# Patient Record
Sex: Female | Born: 1970
Health system: Southern US, Community
[De-identification: ages and names within clinical notes are randomized; demographics above are authoritative.]

## PROBLEM LIST (undated history)

## (undated) DIAGNOSIS — E669 Obesity, unspecified: Secondary | ICD-10-CM

## (undated) DIAGNOSIS — E785 Hyperlipidemia, unspecified: Secondary | ICD-10-CM

## (undated) DIAGNOSIS — I10 Essential (primary) hypertension: Secondary | ICD-10-CM

## (undated) DIAGNOSIS — D219 Benign neoplasm of connective and other soft tissue, unspecified: Secondary | ICD-10-CM

## (undated) DIAGNOSIS — E119 Type 2 diabetes mellitus without complications: Secondary | ICD-10-CM

## (undated) HISTORY — DX: Type 2 diabetes mellitus without complications: E11.9

## (undated) HISTORY — DX: Benign neoplasm of connective and other soft tissue, unspecified: D21.9

## (undated) HISTORY — DX: Obesity, unspecified: E66.9

## (undated) HISTORY — DX: Essential (primary) hypertension: I10

## (undated) HISTORY — DX: Hyperlipidemia, unspecified: E78.5

---
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
## 2002-04-27 HISTORY — PX: ABDOMINAL HYSTERECTOMY: SHX81

## 2002-04-27 HISTORY — PX: SUPRACERVICAL ABDOMINAL HYSTERECTOMY: SHX5393

## 2005-03-18 ENCOUNTER — Ambulatory Visit: Payer: Self-pay | Admitting: Urology

## 2006-06-09 ENCOUNTER — Ambulatory Visit: Payer: Self-pay | Admitting: Urology

## 2008-06-25 ENCOUNTER — Ambulatory Visit: Payer: Self-pay | Admitting: Urology

## 2009-07-25 ENCOUNTER — Ambulatory Visit: Payer: Self-pay | Admitting: Family Medicine

## 2009-07-30 ENCOUNTER — Ambulatory Visit: Payer: Self-pay | Admitting: Urology

## 2012-05-19 ENCOUNTER — Encounter: Payer: Self-pay | Admitting: *Deleted

## 2012-05-19 ENCOUNTER — Encounter: Payer: 59 | Attending: Internal Medicine | Admitting: *Deleted

## 2012-05-19 VITALS — Ht 66.0 in | Wt 231.0 lb

## 2012-05-19 DIAGNOSIS — E669 Obesity, unspecified: Secondary | ICD-10-CM | POA: Insufficient documentation

## 2012-05-19 DIAGNOSIS — I10 Essential (primary) hypertension: Secondary | ICD-10-CM | POA: Insufficient documentation

## 2012-05-19 DIAGNOSIS — Z713 Dietary counseling and surveillance: Secondary | ICD-10-CM | POA: Insufficient documentation

## 2012-05-19 NOTE — Progress Notes (Signed)
  Medical Nutrition Therapy:  Appt start time: 0800 end time:  0900.  Assessment:  Obesity, HTN.  Katrina Roth comes today for help with weight management. Reports she has a solitary kidney since childhood and worried about taking in too much protein.   TANITA  BODY COMP RESULTS  05/19/12   BMI (kg/m^2) 37.3   Fat Mass (lbs) 111.0   Fat Free Mass (lbs) 120.0   Total Body Water (lbs) 88.0   MEDICATIONS: Reconciled with patient during visit   DIETARY INTAKE:  Usual eating pattern includes 3 meals and 0-2 snacks per day.  24-hr recall:  B (7:30 AM): 6 pk Lance crackers, Clif Bar (choc chip or pb choc chip)  Snk (10-11 AM): Orange or apple OR nothing  L (1 PM): 2 cups red or green leaf salad with (3 tbsp) chickpeas, (2 tbsp) dried cranberries, (2 tbsp) pumpkin seeds, (2 tbsp) sunflower seeds, (2 tbsp) dried pineapple or raisins  Snk (PM): Nothing D (PM): Rotissere chicken with broccoli, cauliflower, or winter squash (2 vegs) OR [employer] (1 ea) wing, breast and pinto beans (about 1x/week) Snk (PM): Nothing Beverages: Coke Zero, diet Dr. Reino Kent  Usual physical activity:  ~ 20 min/4 days a week on elliptical   Estimated energy needs: 1400 calories 175-200 g carbohydrates 60-70 g protein (0.8 g/kg ABW) 40-45 g fat  Progress Towards Goal(s):  In progress.   Nutritional Diagnosis:  [place]-3.3 Overweight/obesity As related to poor food choices and sedentary lifestyle.  As evidenced by patient reported food recall and a BMI of  kg/m^2.    Intervention:  Nutrition education including CHO metabolism and affect on weight loss/gain, meal planning, .  Handouts given during visit include:  Meal Planning Card  Low CHO Snack List  Monitoring/Evaluation:  Dietary intake, exercise, and body weight in 2 week(s) per pt request.

## 2012-05-19 NOTE — Patient Instructions (Addendum)
Goals:   Choose more whole grains, lean protein, low-fat dairy, and fruits/non-starchy vegetables.   Limit carbohydrate 2-3 servings (30-45 grams) per meal and 1 serving (15 g + protein serving) per snack.  Aim for >30 min of physical activity daily.  Limit sugar-sweetened beverages, concentrated sweets, and high sodium (processed) foods.  Aim for <2000 mg sodium daily.  Estimated energy needs: 1400 calories 175-200 g carbohydrates 60-70 g protein 40-45 g fat

## 2012-05-21 ENCOUNTER — Encounter: Payer: Self-pay | Admitting: *Deleted

## 2012-06-01 ENCOUNTER — Encounter: Payer: 59 | Attending: Internal Medicine | Admitting: *Deleted

## 2012-06-01 VITALS — Ht 66.0 in | Wt 230.0 lb

## 2012-06-01 DIAGNOSIS — I10 Essential (primary) hypertension: Secondary | ICD-10-CM | POA: Insufficient documentation

## 2012-06-01 DIAGNOSIS — E669 Obesity, unspecified: Secondary | ICD-10-CM | POA: Insufficient documentation

## 2012-06-01 DIAGNOSIS — Z713 Dietary counseling and surveillance: Secondary | ICD-10-CM | POA: Insufficient documentation

## 2012-06-01 NOTE — Progress Notes (Signed)
  Medical Nutrition Therapy:  Appt start time: 0800   End time:  0830.  Priimary Concerns Today:  Obesity, HTN - F/U.  Katrina Roth comes today for f/u with a 2.5 lb loss of FAT MASS in the last 2 weeks. Has increased intensity of exercise and changed to lower CHO options. Reports she has not been back to [employer] since last visit (05/19/12). Doing very well. Continue same goals.   TANITA  BODY COMP RESULTS  05/19/12 06/01/12   BMI (kg/m^2) 37.3 37.1   Fat Mass (lbs) 111.0 109.5   Fat Free Mass (lbs) 120.0 120.5   Total Body Water (lbs) 88.0 88.0   MEDICATIONS: No changes reported   DIETARY INTAKE:  Usual eating pattern includes 3 meals and 0-2 snacks per day.  24-hr recall:  B (7:30 AM): 6 pk Lance crackers, Quest bar peanut butter supreme  Snk (10-11 AM): Orange or apple OR nothing  L (1 PM): 2 cups red or green leaf salad with (3 tbsp) chickpeas, (2 tbsp) dried cranberries, (2 tbsp) pumpkin seeds, (2 tbsp) sunflower seeds, (2 tbsp) dried pineapple or raisins  Snk (PM): Nothing or pc of fruit D (PM): 4-5 oz Rotissere chicken with broccoli, cauliflower, or winter squash (2 vegs) OR [employer] (1 ea) wing, breast and pinto beans (about 1x/week) Snk (PM): Nothing Beverages: Coke Zero, diet Dr. Reino Kent  Usual physical activity:  ~ 20 min/4 days a week on elliptical   Estimated energy needs: 1400 calories 175-200 g carbohydrates 60-70 g protein (0.8 g/kg ABW) 40-45 g fat 2000 mg or less of sodium  Progress Towards Goal(s):  In progress.   Nutritional Diagnosis:  [place]-3.3 Overweight/obesity As related to poor food choices and sedentary lifestyle.  As evidenced by patient reported food recall and a BMI of  kg/m^2.    Intervention:  Nutrition education including continued discussion of CHO metabolism and affect on weight loss/gain, meal planning. Low sodium seasoning options and high sodium foods discussed.   Handouts given during visit include:  K+ Content of Foods  Low Sodium Seasoning  List  Monitoring/Evaluation:  Dietary intake, exercise, and body weight in 4 week(s).

## 2012-06-03 ENCOUNTER — Encounter: Payer: Self-pay | Admitting: *Deleted

## 2012-06-03 NOTE — Patient Instructions (Signed)
Goals: CONTINUE  Choose more whole grains, lean protein, low-fat dairy, and fruits/non-starchy vegetables.   Limit carbohydrate 2-3 servings (30-45 grams) per meal and 1 serving (15 g + protein serving) per snack.  Aim for >30 min of physical activity daily.  Limit sugar-sweetened beverages, concentrated sweets, and high sodium (processed) foods.  Aim for <2000 mg sodium daily.  Estimated energy needs: 1400 calories 175-200 g carbohydrates 60-70 g protein 40-45 g fat

## 2012-06-11 ENCOUNTER — Other Ambulatory Visit: Payer: Self-pay

## 2012-06-29 ENCOUNTER — Ambulatory Visit: Payer: 59 | Admitting: *Deleted

## 2012-07-06 ENCOUNTER — Encounter: Payer: Self-pay | Admitting: *Deleted

## 2012-07-06 ENCOUNTER — Encounter: Payer: 59 | Attending: Internal Medicine | Admitting: *Deleted

## 2012-07-06 DIAGNOSIS — E669 Obesity, unspecified: Secondary | ICD-10-CM | POA: Insufficient documentation

## 2012-07-06 DIAGNOSIS — I10 Essential (primary) hypertension: Secondary | ICD-10-CM | POA: Insufficient documentation

## 2012-07-06 DIAGNOSIS — Z713 Dietary counseling and surveillance: Secondary | ICD-10-CM | POA: Insufficient documentation

## 2012-07-06 NOTE — Patient Instructions (Addendum)
Goals: CONTINUE  Choose more whole grains, lean protein, low-fat dairy, and fruits/non-starchy vegetables.   Limit carbohydrate 2-3 servings (30-45 grams) per meal and 1 serving (15 g + protein serving) per snack.  Have protein with all carbs (including snacks)  Aim for >30 min of physical activity daily.  Limit sugary beverages (1/2 & 1/2 sweet tea) and high sodium (processed) foods.  Switch to vinaigrette dressing, limit seeds in salad to decrease fat intake.   Aim for <2000 mg sodium daily.  Estimated energy needs: 1400 calories 175-200 g carbohydrates 60-70 g protein 40-45 g fat

## 2012-07-06 NOTE — Progress Notes (Addendum)
  Medical Nutrition Therapy:  Appt start time: 0800   End time:  0830.  Priimary Concerns Today:  Obesity, HTN - F/U.  Katrina Roth returns very discouraged with lack of weight loss gain of . Has increased intensity of exercise and changed to lower CHO options. Continue same goals.   TANITA  BODY COMP RESULTS  05/19/12 06/01/12 07/06/12   BMI (kg/m^2) 37.3 37.1 37.2   Fat Mass (lbs) 111.0 109.5 111.0   Fat Free Mass (lbs) 120.0 120.5 119.5   Total Body Water (lbs) 88.0 88.0 87.5   MEDICATIONS: No changes reported   DIETARY INTAKE:  Usual eating pattern includes 3 meals and 0-2 snacks per day.  24-hr recall:  B (7:30 AM): [employer] sausage and egg biscuit  Snk (10-11 AM): Orange or apple (15g CHO)   L (1 PM): 2 cups red or green leaf salad with (3 tbsp) chickpeas, (2 tbsp) dried cranberries, (2 tbsp) pumpkin seeds, (2 tbsp) sunflower seeds, (2 tbsp) dried pineapple or raisins   Snk (PM): Nothing or pc of fruit D (PM): 4-5 oz Rotissere chicken with broccoli, cauliflower, or winter squash (2 vegs) OR [employer] (1 ea) wing, breast and pinto beans (about 1x/week) Snk (PM): Nothing Beverages: Coke Zero, diet Dr. Reino Kent  Usual physical activity:  ~ 60 min/2 days Zumba; Doesn't like to look at self in mirror while exercising.   Estimated energy needs: 1400 calories 175-200 g carbohydrates 60-70 g protein (0.8 g/kg ABW) 40-45 g fat 2000 mg or less of sodium  Progress Towards Goal(s):  In progress.   Nutritional Diagnosis:  [place]-3.3 Overweight/obesity As related to poor food choices and sedentary lifestyle.  As evidenced by patient reported food recall and a BMI of  kg/m^2.    Intervention: Continued nutrition education including further discussion of CHO metabolism and affect on weight loss/gain, meal planning. Low sodium seasoning options and high sodium foods discussed.   Monitoring/Evaluation:  Dietary intake, exercise, and body weight in 4 week(s).

## 2012-08-03 ENCOUNTER — Ambulatory Visit: Payer: 59 | Admitting: *Deleted

## 2013-03-02 ENCOUNTER — Other Ambulatory Visit: Payer: Self-pay

## 2015-05-07 MED FILL — OLMSRTN-AMLDPN-HCTZ 40-5-12: 40-5-12.5 | 90 days supply | Qty: 90 | Fill #1

## 2015-06-27 DIAGNOSIS — Z01419 Encounter for gynecological examination (general) (routine) without abnormal findings: Secondary | ICD-10-CM | POA: Diagnosis not present

## 2015-06-27 DIAGNOSIS — Z124 Encounter for screening for malignant neoplasm of cervix: Secondary | ICD-10-CM | POA: Diagnosis not present

## 2015-06-27 DIAGNOSIS — Z1231 Encounter for screening mammogram for malignant neoplasm of breast: Secondary | ICD-10-CM | POA: Diagnosis not present

## 2015-06-27 DIAGNOSIS — Z8742 Personal history of other diseases of the female genital tract: Secondary | ICD-10-CM | POA: Diagnosis not present

## 2015-07-02 DIAGNOSIS — Z0000 Encounter for general adult medical examination without abnormal findings: Secondary | ICD-10-CM | POA: Diagnosis not present

## 2015-07-03 DIAGNOSIS — Z23 Encounter for immunization: Secondary | ICD-10-CM | POA: Diagnosis not present

## 2015-07-03 DIAGNOSIS — Z0000 Encounter for general adult medical examination without abnormal findings: Secondary | ICD-10-CM | POA: Diagnosis not present

## 2015-07-03 MED FILL — ALPRAZolam 0.25 MG TABS: 0.25 | 10 days supply | Qty: 30 | Fill #0

## 2015-08-07 MED FILL — ALPRAZolam 0.25 MG TABS: 0.25 | 10 days supply | Qty: 30 | Fill #1

## 2015-08-07 MED FILL — OLMSRTN-AMLDPN-HCTZ 40-5-12: 40-5-12.5 | 90 days supply | Qty: 90 | Fill #2

## 2015-08-30 DIAGNOSIS — S61102A Unspecified open wound of left thumb with damage to nail, initial encounter: Secondary | ICD-10-CM | POA: Diagnosis not present

## 2015-08-30 DIAGNOSIS — S61122S Laceration with foreign body of left thumb with damage to nail, sequela: Secondary | ICD-10-CM | POA: Diagnosis not present

## 2015-09-08 DIAGNOSIS — Z4802 Encounter for removal of sutures: Secondary | ICD-10-CM | POA: Diagnosis not present

## 2015-11-14 MED FILL — OLMSRTN-AMLDPN-HCTZ 40-5-12: 40-5-12.5 | 90 days supply | Qty: 90 | Fill #3

## 2016-01-17 DIAGNOSIS — I10 Essential (primary) hypertension: Secondary | ICD-10-CM | POA: Diagnosis not present

## 2016-01-21 DIAGNOSIS — Z23 Encounter for immunization: Secondary | ICD-10-CM | POA: Diagnosis not present

## 2016-01-21 DIAGNOSIS — I7300 Raynaud's syndrome without gangrene: Secondary | ICD-10-CM | POA: Diagnosis not present

## 2016-01-21 DIAGNOSIS — I10 Essential (primary) hypertension: Secondary | ICD-10-CM | POA: Diagnosis not present

## 2016-02-14 MED FILL — OLMSRTN-AMLDPN-HCTZ 40-5-12: 40-5-12.5 | 90 days supply | Qty: 90 | Fill #0

## 2016-04-15 DIAGNOSIS — H5203 Hypermetropia, bilateral: Secondary | ICD-10-CM | POA: Diagnosis not present

## 2016-04-15 DIAGNOSIS — H524 Presbyopia: Secondary | ICD-10-CM | POA: Diagnosis not present

## 2016-05-19 MED FILL — OLMSRTN-AMLDPN-HCTZ 40-5-12: 40-5-12.5 | 90 days supply | Qty: 90 | Fill #1

## 2016-08-24 MED FILL — OLMSRTN-AMLDPN-HCTZ 40-5-12: 40-5-12.5 | 90 days supply | Qty: 90 | Fill #2

## 2016-08-27 DIAGNOSIS — Z6834 Body mass index (BMI) 34.0-34.9, adult: Secondary | ICD-10-CM | POA: Diagnosis not present

## 2016-08-27 DIAGNOSIS — H578 Other specified disorders of eye and adnexa: Secondary | ICD-10-CM | POA: Diagnosis not present

## 2016-08-27 MED FILL — TOBRAMYCIN 0.3% EYE DROPS: 0.3 | 9 days supply | Qty: 5 | Fill #0

## 2016-10-27 MED FILL — APRACLONIDINE HCL 0.5% DROP: 0.5 | 12 days supply | Qty: 10 | Fill #0

## 2016-11-06 ENCOUNTER — Encounter: Payer: Self-pay | Admitting: Emergency Medicine

## 2016-11-06 ENCOUNTER — Emergency Department: Payer: 59

## 2016-11-06 ENCOUNTER — Emergency Department
Admission: EM | Admit: 2016-11-06 | Discharge: 2016-11-06 | Disposition: A | Discharge status: Home / Self Care | Payer: 59 | Attending: Emergency Medicine | Admitting: Emergency Medicine

## 2016-11-06 DIAGNOSIS — Z79899 Other long term (current) drug therapy: Secondary | ICD-10-CM | POA: Diagnosis not present

## 2016-11-06 DIAGNOSIS — R202 Paresthesia of skin: Secondary | ICD-10-CM | POA: Insufficient documentation

## 2016-11-06 DIAGNOSIS — I10 Essential (primary) hypertension: Secondary | ICD-10-CM | POA: Insufficient documentation

## 2016-11-06 DIAGNOSIS — R200 Anesthesia of skin: Secondary | ICD-10-CM | POA: Diagnosis not present

## 2016-11-06 LAB — COMPREHENSIVE METABOLIC PANEL
ALT: 13 U/L — ABNORMAL LOW (ref 14–54)
ANION GAP: 8 (ref 5–15)
AST: 20 U/L (ref 15–41)
Albumin: 4.1 g/dL (ref 3.5–5.0)
Alkaline Phosphatase: 66 U/L (ref 38–126)
BILIRUBIN TOTAL: 0.4 mg/dL (ref 0.3–1.2)
BUN: 21 mg/dL — ABNORMAL HIGH (ref 6–20)
CO2: 30 mmol/L (ref 22–32)
Calcium: 9.3 mg/dL (ref 8.9–10.3)
Chloride: 104 mmol/L (ref 101–111)
Creatinine, Ser: 1.14 mg/dL — ABNORMAL HIGH (ref 0.44–1.00)
GFR calc Af Amer: >60 mL/min (ref >60)
GFR, EST NON AFRICAN AMERICAN: 56 mL/min — AB (ref >60)
Glucose, Bld: 124 mg/dL — ABNORMAL HIGH (ref 65–99)
POTASSIUM: 3.5 mmol/L (ref 3.5–5.1)
Sodium: 142 mmol/L (ref 135–145)
TOTAL PROTEIN: 7.5 g/dL (ref 6.5–8.1)

## 2016-11-06 LAB — DIFFERENTIAL
Basophils Absolute: 0.2 10*3/uL — ABNORMAL HIGH (ref 0–0.1)
Basophils Relative: 4 %
EOS ABS: 0.1 10*3/uL (ref 0–0.7)
EOS PCT: 3 %
LYMPHS ABS: 1 10*3/uL (ref 1.0–3.6)
Lymphocytes Relative: 21 %
MONOS PCT: 6 %
Monocytes Absolute: 0.3 10*3/uL (ref 0.2–0.9)
NEUTROS PCT: 66 %
Neutro Abs: 3 10*3/uL (ref 1.4–6.5)

## 2016-11-06 LAB — CBC
HEMATOCRIT: 40 % (ref 35.0–47.0)
HEMOGLOBIN: 13.1 g/dL (ref 12.0–16.0)
MCH: 26.3 pg (ref 26.0–34.0)
MCHC: 32.7 g/dL (ref 32.0–36.0)
MCV: 80.4 fL (ref 80.0–100.0)
Platelets: 228 10*3/uL (ref 150–440)
RBC: 4.97 MIL/uL (ref 3.80–5.20)
RDW: 15.1 % — AB (ref 11.5–14.5)
WBC: 4.6 10*3/uL (ref 3.6–11.0)

## 2016-11-06 LAB — TROPONIN I

## 2016-11-06 LAB — PROTIME-INR
INR: 0.88
Prothrombin Time: 11.9 seconds (ref 11.4–15.2)

## 2016-11-06 LAB — APTT: aPTT: 33 seconds (ref 24–36)

## 2016-11-06 NOTE — ED Notes (Signed)
Patient transported to CT 

## 2016-11-06 NOTE — ED Notes (Addendum)
Pt states she has left sided numbness. (arm down to leg). Pt also states that her dermatologist gave her some eye drops (Apracllonidine) for the drooping and told her it would get better in approximately 2-3 weeks.

## 2016-11-06 NOTE — ED Triage Notes (Signed)
Pt to triage with steady gait, no distress noted. Pt woke with AM with numbness and tingling to the left side of face, arm and hand. Pt has facial droop but has had recent injection to the left eye. Pt has no drift, no slurred speech and is able to ambulate without weakness.

## 2016-11-06 NOTE — Discharge Instructions (Signed)
Please follow up with neurology for further evaluation of your paresthesia. Please return in you develop any weakness or other symptoms

## 2016-11-06 NOTE — ED Provider Notes (Signed)
[facility] Emergency Department Provider Note   ____________________________________________   First MD Initiated Contact with Patient 11/06/16 [phone]     (approximate)  I have reviewed the triage vital signs and the nursing notes.   HISTORY  Chief Complaint Numbness    HPI Katrina Roth is a 46 y.o. female who comes into the hospital today with some tingling in her left leg and left arm. She reports that she fell asleep last night and woke up at 11 PM. She noticed that her left arm and leg were tingly. She fell back asleep and woke up this morning and still felt the same so she decided to come into the hospital to get checked out. She reports that her entire left leg and left arm. She denies any weakness, slurred speech or numbness. She also reports that her left eyelid is different than the right. The patient had a procedure donewhere she was injected into her left eyelid and the dermatologist office and then had some left eyelid drooping. She was given some apraclonidine by a different dermatologist to help with eyelid drooping. She reports otherwise she has no other complaints. The patient has no shortness of breath and no chest pain. She also has no nausea or vomiting. The patient is here today for evaluation of the symptoms.   Past Medical History:  Diagnosis Date  . Hyperlipidemia   . Hypertension   . Obesity     There are no active problems to display for this patient.   Past Surgical History:  Procedure Laterality Date  . ABDOMINAL HYSTERECTOMY  2004   Partial  . 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 Authorizing Provider  Cyanocobalamin (B-12) 3000 MCG CAPS Take 1 capsule by mouth 2 (two) times daily.    [provider]  Magnesium 250 MG TABS Take 1 tablet by mouth 3 (three) times daily.    [provider]  Melatonin 3 MG CAPS  Take 3 capsules by mouth at bedtime.    [provider]  nadolol (CORGARD) 40 MG tablet Take 40 mg by mouth daily.    [provider]  olmesartan-hydrochlorothiazide (BENICAR HCT) 40-25 MG per tablet Take 1 tablet by mouth daily.    [provider]  UNABLE TO FIND Take 12.5 mg by mouth at bedtime as needed and may repeat dose one time if needed. Med Name: EQUATE NIGHTTIME SLEEP AID    [provider]  UNABLE TO FIND Take 1 capsule by mouth daily. Med Name: [place] 05/16/12   [provider]    Allergies Lactose intolerance (gi) and Other  History reviewed. No pertinent family history.  Social History Social History  Substance Use Topics  . Smoking status: Never Smoker  . Smokeless tobacco: Never Used  . Alcohol use Yes     Comment: 1 glass red wine qod    Review of Systems  Constitutional: No fever/chills Eyes: No visual changes. ENT: No sore throat. Cardiovascular: Denies chest pain. Respiratory: Denies shortness of breath. Gastrointestinal: No abdominal pain.  No nausea, no vomiting.  No diarrhea.  No constipation. Genitourinary: Negative for dysuria. Musculoskeletal: Negative for back pain. Skin: Negative for rash. Neurological: tingling to left arm and leg   ____________________________________________   PHYSICAL EXAM:  VITAL SIGNS: ED Triage Vitals [11/06/16 0543]  Enc Vitals Group  BP (!) 138/99     Pulse Rate (!) 108     Resp 16     Temp 98 F (36.7 C)     Temp Source Oral     SpO2 99 %     Weight 200 lb (90.7 kg)     Height 5\' 6"  (1.676 m)     Head Circumference      Peak Flow      Pain Score      Pain Loc      Pain Edu?      Excl. in [place]?     Constitutional: Alert and oriented. Well appearing and in mild distress. Eyes: Conjunctivae are normal. PERRL. EOMI. Head: Atraumatic. Nose: No congestion/rhinnorhea. Mouth/Throat: Mucous membranes are moist.  Oropharynx  non-erythematous. Cardiovascular: Normal rate, regular rhythm. Grossly normal heart sounds.  Good peripheral circulation. Respiratory: Normal respiratory effort.  No retractions. Lungs CTAB. Gastrointestinal: Soft and nontender. No distention. Positive bowel sounds Musculoskeletal: No lower extremity tenderness nor edema.   Neurologic:  Normal speech and language. Cranial nerves II through XII are grossly intact with no focal motor or neuro deficits. Patient has some very mild subtle left ptosis with no myosis no left facial droop. Patient has no numbness to her arms or legs and her shrink this 5 out of 5 in upper and lower extremity. No pronator drift no difficulty with finger to nose. Skin:  Skin is warm, dry and intact.  Psychiatric: Mood and affect are normal.  ____________________________________________   LABS (all labs ordered are listed, but only abnormal results are displayed)  Labs Reviewed  CBC - Abnormal; Notable for the following:       Result Value   RDW 15.1 (*)    All other components within normal limits  DIFFERENTIAL - Abnormal; Notable for the following:    Basophils Absolute 0.2 (*)    All other components within normal limits  COMPREHENSIVE METABOLIC PANEL - Abnormal; Notable for the following:    Glucose, Bld 124 (*)    BUN 21 (*)    Creatinine, Ser 1.14 (*)    ALT 13 (*)    GFR calc non Af Amer 56 (*)    All other components within normal limits  PROTIME-INR  APTT  TROPONIN I  CBG MONITORING, ED   ____________________________________________  EKG  ED ECG REPORT I, Loney Hering, the attending physician, personally viewed and interpreted this ECG.   Date: 11/06/2016  EKG Time: 548  Rate: 92  Rhythm: normal sinus rhythm  Axis: normal  Intervals:none  ST&T Change: none  ____________________________________________  RADIOLOGY  Ct Head Wo Contrast  Result Date: 11/06/2016 CLINICAL DATA:  46 year old female with left-sided numbness. EXAM:  CT HEAD WITHOUT CONTRAST TECHNIQUE: Contiguous axial images were obtained from the base of the skull through the vertex without intravenous contrast. COMPARISON:  None. FINDINGS: Brain: No evidence of acute infarction, hemorrhage, hydrocephalus, extra-axial collection or mass lesion/mass effect. Vascular: No hyperdense vessel or unexpected calcification. Skull: Normal. Negative for fracture or focal lesion. Sinuses/Orbits: No acute finding. Other: None. IMPRESSION: Normal noncontrast CT of the brain. Electronically Signed   By: Anner Crete M.D.   On: 11/06/2016 06:29    ____________________________________________   PROCEDURES  Procedure(s) performed: None  Procedures  Critical Care performed: No  ____________________________________________   INITIAL IMPRESSION / ASSESSMENT AND PLAN / ED COURSE  Pertinent labs & imaging results that were available during my care of the patient were reviewed by me and considered  in my medical decision making (see chart for details).  This is a 46 year old female who comes into the hospital today with some tingling to her left arm and leg. The patient woke up with this last night at 11 PM and then again this morning. The patient has no numbness and no weakness. We did perform a CT scan of her head as well as some blood work. The patient's workup is unremarkable. I will discharge the patient home to have her follow-up with neurology for further evaluation of these symptoms. The patient does have some ptosis but she's been having this prior to the symptoms status post her dermatologic intervention. The patient will be discharged to follow-up.      ____________________________________________   FINAL CLINICAL IMPRESSION(S) / ED DIAGNOSES  Final diagnoses:  Paresthesia      NEW MEDICATIONS STARTED DURING THIS VISIT:  New Prescriptions   No medications on file     Note:  This document was prepared using Dragon voice recognition software  and may include unintentional dictation errors.    Loney Hering, MD 11/06/16 [phone]

## 2016-11-09 DIAGNOSIS — R200 Anesthesia of skin: Secondary | ICD-10-CM | POA: Diagnosis not present

## 2016-11-09 DIAGNOSIS — Z6833 Body mass index (BMI) 33.0-33.9, adult: Secondary | ICD-10-CM | POA: Diagnosis not present

## 2016-11-09 DIAGNOSIS — M542 Cervicalgia: Secondary | ICD-10-CM | POA: Diagnosis not present

## 2016-11-09 DIAGNOSIS — I10 Essential (primary) hypertension: Secondary | ICD-10-CM | POA: Diagnosis not present

## 2016-11-24 MED FILL — OLMSRTN-AMLDPN-HCTZ 40-5-12: 40-5-12.5 | 90 days supply | Qty: 90 | Fill #3

## 2016-12-08 DIAGNOSIS — Z0000 Encounter for general adult medical examination without abnormal findings: Secondary | ICD-10-CM | POA: Diagnosis not present

## 2016-12-15 DIAGNOSIS — Z6833 Body mass index (BMI) 33.0-33.9, adult: Secondary | ICD-10-CM | POA: Diagnosis not present

## 2016-12-15 DIAGNOSIS — Z0000 Encounter for general adult medical examination without abnormal findings: Secondary | ICD-10-CM | POA: Diagnosis not present

## 2017-01-04 DIAGNOSIS — Z6834 Body mass index (BMI) 34.0-34.9, adult: Secondary | ICD-10-CM | POA: Diagnosis not present

## 2017-01-04 DIAGNOSIS — Z01419 Encounter for gynecological examination (general) (routine) without abnormal findings: Secondary | ICD-10-CM | POA: Diagnosis not present

## 2017-01-04 DIAGNOSIS — Z780 Asymptomatic menopausal state: Secondary | ICD-10-CM | POA: Diagnosis not present

## 2017-01-04 MED FILL — GABAPENTIN 100 MG CAPS: 100 | 30 days supply | Qty: 30 | Fill #0

## 2017-01-25 DIAGNOSIS — Z1231 Encounter for screening mammogram for malignant neoplasm of breast: Secondary | ICD-10-CM | POA: Diagnosis not present

## 2017-02-02 ENCOUNTER — Other Ambulatory Visit: Payer: Self-pay | Admitting: Obstetrics and Gynecology

## 2017-02-02 DIAGNOSIS — R928 Other abnormal and inconclusive findings on diagnostic imaging of breast: Secondary | ICD-10-CM

## 2017-02-05 ENCOUNTER — Ambulatory Visit
Admission: RE | Admit: 2017-02-05 | Discharge: 2017-02-05 | Disposition: A | Discharge status: Home / Self Care | Payer: 59 | Source: Ambulatory Visit | Attending: Obstetrics and Gynecology | Admitting: Obstetrics and Gynecology

## 2017-02-05 ENCOUNTER — Other Ambulatory Visit: Payer: Self-pay | Admitting: Obstetrics and Gynecology

## 2017-02-05 DIAGNOSIS — R928 Other abnormal and inconclusive findings on diagnostic imaging of breast: Secondary | ICD-10-CM

## 2017-02-05 DIAGNOSIS — N6489 Other specified disorders of breast: Secondary | ICD-10-CM | POA: Diagnosis not present

## 2017-02-05 DIAGNOSIS — N6310 Unspecified lump in the right breast, unspecified quadrant: Secondary | ICD-10-CM

## 2017-02-12 MED FILL — GABAPENTIN 100 MG CAPS: 100 | 30 days supply | Qty: 60 | Fill #0

## 2017-02-26 MED FILL — OLMSRTN-AMLDPN-HCTZ 40-5-12: 40-5-12.5 | 90 days supply | Qty: 90 | Fill #0

## 2017-03-10 DIAGNOSIS — E782 Mixed hyperlipidemia: Secondary | ICD-10-CM | POA: Diagnosis not present

## 2017-03-15 DIAGNOSIS — E785 Hyperlipidemia, unspecified: Secondary | ICD-10-CM | POA: Diagnosis not present

## 2017-03-15 DIAGNOSIS — Z23 Encounter for immunization: Secondary | ICD-10-CM | POA: Diagnosis not present

## 2017-03-15 DIAGNOSIS — I10 Essential (primary) hypertension: Secondary | ICD-10-CM | POA: Diagnosis not present

## 2017-03-24 MED FILL — GABAPENTIN 100 MG CAPS: 100 | 30 days supply | Qty: 60 | Fill #1

## 2017-04-16 DIAGNOSIS — H52203 Unspecified astigmatism, bilateral: Secondary | ICD-10-CM | POA: Diagnosis not present

## 2017-04-16 DIAGNOSIS — H524 Presbyopia: Secondary | ICD-10-CM | POA: Diagnosis not present

## 2017-04-16 DIAGNOSIS — H5203 Hypermetropia, bilateral: Secondary | ICD-10-CM | POA: Diagnosis not present

## 2017-04-25 MED FILL — GABAPENTIN 100 MG CAPS: 100 | 30 days supply | Qty: 60 | Fill #2

## 2017-05-27 MED FILL — OLMSRTN-AMLDPN-HCTZ 40-5-12: 40-5-12.5 | 90 days supply | Qty: 90 | Fill #1

## 2017-05-27 MED FILL — GABAPENTIN 100 MG CAPS: 100 | 30 days supply | Qty: 60 | Fill #3

## 2017-07-13 MED FILL — GABAPENTIN 100 MG CAPSULE: 100 | 30 days supply | Qty: 60 | Fill #4

## 2017-08-12 ENCOUNTER — Ambulatory Visit
Admission: RE | Admit: 2017-08-12 | Discharge: 2017-08-12 | Disposition: A | Discharge status: Home / Self Care | Payer: 59 | Source: Ambulatory Visit | Attending: Obstetrics and Gynecology | Admitting: Obstetrics and Gynecology

## 2017-08-12 ENCOUNTER — Ambulatory Visit: Admission: RE | Admit: 2017-08-12 | Payer: 59 | Source: Ambulatory Visit

## 2017-08-12 ENCOUNTER — Other Ambulatory Visit: Payer: Self-pay | Admitting: Obstetrics and Gynecology

## 2017-08-12 DIAGNOSIS — N63 Unspecified lump in unspecified breast: Secondary | ICD-10-CM

## 2017-08-12 DIAGNOSIS — Z1231 Encounter for screening mammogram for malignant neoplasm of breast: Secondary | ICD-10-CM

## 2017-08-12 DIAGNOSIS — R928 Other abnormal and inconclusive findings on diagnostic imaging of breast: Secondary | ICD-10-CM | POA: Diagnosis not present

## 2017-08-12 DIAGNOSIS — N6310 Unspecified lump in the right breast, unspecified quadrant: Secondary | ICD-10-CM

## 2017-08-17 MED FILL — GABAPENTIN 100 MG CAPSULE: 100 | 30 days supply | Qty: 60 | Fill #5

## 2017-08-18 ENCOUNTER — Other Ambulatory Visit: Payer: Self-pay | Admitting: Obstetrics and Gynecology

## 2017-08-18 DIAGNOSIS — N63 Unspecified lump in unspecified breast: Secondary | ICD-10-CM

## 2017-09-07 MED FILL — OLMSRTN-AMLDPN-HCTZ 40-5-12: 40-5-12.5 | 30 days supply | Qty: 30 | Fill #2

## 2017-10-06 MED FILL — OLMSRTN-AMLDPN-HCTZ 40-5-12: 40-5-12.5 | 30 days supply | Qty: 30 | Fill #3

## 2017-11-07 MED FILL — OLMSRTN-AMLDPN-HCTZ 40-5-12: 40-5-12.5 | 30 days supply | Qty: 30 | Fill #4

## 2017-12-13 MED FILL — OLMSRTN-AMLDPN-HCTZ 40-5-12: 40-5-12.5 | 30 days supply | Qty: 30 | Fill #5

## 2018-01-14 MED FILL — OLMSRTN-AMLDPN-HCTZ 40-5-12: 40-5-12.5 | 30 days supply | Qty: 30 | Fill #6

## 2018-02-01 DIAGNOSIS — Z01419 Encounter for gynecological examination (general) (routine) without abnormal findings: Secondary | ICD-10-CM | POA: Diagnosis not present

## 2018-02-01 DIAGNOSIS — Z6836 Body mass index (BMI) 36.0-36.9, adult: Secondary | ICD-10-CM | POA: Diagnosis not present

## 2018-02-10 MED FILL — OLMSRTN-AMLDPN-HCTZ 40-5-12: 40-5-12.5 | 30 days supply | Qty: 30 | Fill #7

## 2018-02-21 ENCOUNTER — Ambulatory Visit
Admission: RE | Admit: 2018-02-21 | Discharge: 2018-02-21 | Disposition: A | Discharge status: Home / Self Care | Payer: 59 | Source: Ambulatory Visit | Attending: Obstetrics and Gynecology | Admitting: Obstetrics and Gynecology

## 2018-02-21 ENCOUNTER — Ambulatory Visit: Payer: 59

## 2018-02-21 DIAGNOSIS — R928 Other abnormal and inconclusive findings on diagnostic imaging of breast: Secondary | ICD-10-CM | POA: Diagnosis not present

## 2018-02-21 DIAGNOSIS — N63 Unspecified lump in unspecified breast: Secondary | ICD-10-CM

## 2018-03-17 DIAGNOSIS — Z13220 Encounter for screening for lipoid disorders: Secondary | ICD-10-CM | POA: Diagnosis not present

## 2018-03-17 DIAGNOSIS — Z131 Encounter for screening for diabetes mellitus: Secondary | ICD-10-CM | POA: Diagnosis not present

## 2018-03-17 DIAGNOSIS — I10 Essential (primary) hypertension: Secondary | ICD-10-CM | POA: Diagnosis not present

## 2018-03-17 DIAGNOSIS — E669 Obesity, unspecified: Secondary | ICD-10-CM | POA: Diagnosis not present

## 2018-03-17 MED FILL — OLMSRTN-AMLDPN-HCTZ 40-5-12: 40-5-12.5 | 14 days supply | Qty: 14 | Fill #0

## 2018-03-29 MED FILL — SF 5000 PLUS CREAM: 1.1 | 30 days supply | Qty: 51 | Fill #0

## 2018-03-31 DIAGNOSIS — R7303 Prediabetes: Secondary | ICD-10-CM | POA: Diagnosis not present

## 2018-03-31 DIAGNOSIS — I10 Essential (primary) hypertension: Secondary | ICD-10-CM | POA: Diagnosis not present

## 2018-03-31 DIAGNOSIS — E669 Obesity, unspecified: Secondary | ICD-10-CM | POA: Diagnosis not present

## 2018-03-31 DIAGNOSIS — E785 Hyperlipidemia, unspecified: Secondary | ICD-10-CM | POA: Diagnosis not present

## 2018-03-31 MED FILL — OLMSRTN-AMLDPN-HCTZ 40-5-12: 40-5-12.5 | 30 days supply | Qty: 30 | Fill #0

## 2018-04-18 DIAGNOSIS — H52223 Regular astigmatism, bilateral: Secondary | ICD-10-CM | POA: Diagnosis not present

## 2018-04-18 DIAGNOSIS — H5203 Hypermetropia, bilateral: Secondary | ICD-10-CM | POA: Diagnosis not present

## 2018-05-04 MED FILL — OLMSRTN-AMLDPN-HCTZ 40-5-12: 40-5-12.5 | 30 days supply | Qty: 30 | Fill #1

## 2018-06-07 MED FILL — OLMSRTN-AMLDPN-HCTZ 40-5-12: 40-5-12.5 | 30 days supply | Qty: 30 | Fill #2

## 2018-06-30 DIAGNOSIS — E669 Obesity, unspecified: Secondary | ICD-10-CM | POA: Diagnosis not present

## 2018-06-30 DIAGNOSIS — E785 Hyperlipidemia, unspecified: Secondary | ICD-10-CM | POA: Diagnosis not present

## 2018-06-30 DIAGNOSIS — R7303 Prediabetes: Secondary | ICD-10-CM | POA: Diagnosis not present

## 2018-06-30 DIAGNOSIS — I10 Essential (primary) hypertension: Secondary | ICD-10-CM | POA: Diagnosis not present

## 2018-07-04 DIAGNOSIS — E785 Hyperlipidemia, unspecified: Secondary | ICD-10-CM | POA: Diagnosis not present

## 2018-07-04 DIAGNOSIS — E669 Obesity, unspecified: Secondary | ICD-10-CM | POA: Diagnosis not present

## 2018-07-04 DIAGNOSIS — I10 Essential (primary) hypertension: Secondary | ICD-10-CM | POA: Diagnosis not present

## 2018-07-04 DIAGNOSIS — R7303 Prediabetes: Secondary | ICD-10-CM | POA: Diagnosis not present

## 2018-07-11 MED FILL — OLMSRTN-AMLDPN-HCTZ 40-5-12: 40-5-12.5 | 30 days supply | Qty: 30 | Fill #0

## 2018-08-15 MED FILL — OLMSRTN-AMLDPN-HCTZ 40-5-12: 40-5-12.5 | 30 days supply | Qty: 30 | Fill #1

## 2018-08-31 MED FILL — CONTRAVE ER 8-90 MG TABLET: 8-90 | 30 days supply | Qty: 30 | Fill #0

## 2018-09-14 MED FILL — OLMSRTN-AMLDPN-HCTZ 40-5-12: 40-5-12.5 | 30 days supply | Qty: 30 | Fill #2

## 2018-09-28 MED FILL — CONTRAVE ER 8-90 MG TABLET: 8-90 | 30 days supply | Qty: 30 | Fill #0

## 2018-10-20 MED FILL — OLMSRTN-AMLDPN-HCTZ 40-5-12: 40-5-12.5 | 30 days supply | Qty: 30 | Fill #0

## 2018-11-04 MED FILL — CONTRAVE ER 8-90 MG TABLET: 8-90 | 30 days supply | Qty: 30 | Fill #0

## 2018-11-22 MED FILL — OLMSRTN-AMLDPN-HCTZ 40-5-12: 40-5-12.5 | 30 days supply | Qty: 30 | Fill #1

## 2018-12-08 DIAGNOSIS — I10 Essential (primary) hypertension: Secondary | ICD-10-CM | POA: Diagnosis not present

## 2018-12-08 DIAGNOSIS — E669 Obesity, unspecified: Secondary | ICD-10-CM | POA: Diagnosis not present

## 2018-12-08 DIAGNOSIS — R7303 Prediabetes: Secondary | ICD-10-CM | POA: Diagnosis not present

## 2018-12-08 DIAGNOSIS — E785 Hyperlipidemia, unspecified: Secondary | ICD-10-CM | POA: Diagnosis not present

## 2018-12-14 DIAGNOSIS — E785 Hyperlipidemia, unspecified: Secondary | ICD-10-CM | POA: Diagnosis not present

## 2018-12-14 DIAGNOSIS — I10 Essential (primary) hypertension: Secondary | ICD-10-CM | POA: Diagnosis not present

## 2018-12-14 DIAGNOSIS — E669 Obesity, unspecified: Secondary | ICD-10-CM | POA: Diagnosis not present

## 2018-12-14 DIAGNOSIS — R7303 Prediabetes: Secondary | ICD-10-CM | POA: Diagnosis not present

## 2018-12-20 DIAGNOSIS — E1169 Type 2 diabetes mellitus with other specified complication: Secondary | ICD-10-CM | POA: Diagnosis not present

## 2018-12-20 DIAGNOSIS — I10 Essential (primary) hypertension: Secondary | ICD-10-CM | POA: Diagnosis not present

## 2018-12-23 MED FILL — OLMSRTN-AMLDPN-HCTZ 40-5-12: 40-5-12.5 | 30 days supply | Qty: 30 | Fill #2

## 2019-01-12 ENCOUNTER — Other Ambulatory Visit: Payer: Self-pay | Admitting: Obstetrics and Gynecology

## 2019-01-12 DIAGNOSIS — Z1231 Encounter for screening mammogram for malignant neoplasm of breast: Secondary | ICD-10-CM

## 2019-01-16 MED FILL — FREESTYLE LITE TEST STRIP: 25 days supply | Qty: 100 | Fill #0

## 2019-01-16 MED FILL — FREESTYLE LITE METER: 20 days supply | Qty: 1 | Fill #0

## 2019-01-16 MED FILL — FREESTYLE LANCETS: 60 days supply | Qty: 200 | Fill #0

## 2019-01-19 DIAGNOSIS — I10 Essential (primary) hypertension: Secondary | ICD-10-CM | POA: Diagnosis not present

## 2019-01-19 DIAGNOSIS — E1169 Type 2 diabetes mellitus with other specified complication: Secondary | ICD-10-CM | POA: Diagnosis not present

## 2019-01-19 DIAGNOSIS — E669 Obesity, unspecified: Secondary | ICD-10-CM | POA: Diagnosis not present

## 2019-01-19 MED FILL — RYBELSUS 7 MG TABS: 7 | 30 days supply | Qty: 30 | Fill #0

## 2019-01-26 MED FILL — OLMSRTN-AMLDPN-HCTZ 40-5-12: 40-5-12.5 | 30 days supply | Qty: 30 | Fill #0

## 2019-02-15 MED FILL — RYBELSUS 7 MG TABS: 7 | 30 days supply | Qty: 30 | Fill #1

## 2019-03-07 DIAGNOSIS — R7303 Prediabetes: Secondary | ICD-10-CM | POA: Diagnosis not present

## 2019-03-07 DIAGNOSIS — E1169 Type 2 diabetes mellitus with other specified complication: Secondary | ICD-10-CM | POA: Diagnosis not present

## 2019-03-07 DIAGNOSIS — I10 Essential (primary) hypertension: Secondary | ICD-10-CM | POA: Diagnosis not present

## 2019-03-07 DIAGNOSIS — E669 Obesity, unspecified: Secondary | ICD-10-CM | POA: Diagnosis not present

## 2019-03-07 DIAGNOSIS — E785 Hyperlipidemia, unspecified: Secondary | ICD-10-CM | POA: Diagnosis not present

## 2019-03-09 DIAGNOSIS — I10 Essential (primary) hypertension: Secondary | ICD-10-CM | POA: Diagnosis not present

## 2019-03-09 DIAGNOSIS — E785 Hyperlipidemia, unspecified: Secondary | ICD-10-CM | POA: Diagnosis not present

## 2019-03-09 DIAGNOSIS — E1169 Type 2 diabetes mellitus with other specified complication: Secondary | ICD-10-CM | POA: Diagnosis not present

## 2019-03-11 MED FILL — OLMSRTN-AMLDPN-HCTZ 40-5-12: 40-5-12.5 | 30 days supply | Qty: 30 | Fill #1

## 2019-03-14 ENCOUNTER — Other Ambulatory Visit: Payer: Self-pay

## 2019-03-14 ENCOUNTER — Ambulatory Visit
Admission: RE | Admit: 2019-03-14 | Discharge: 2019-03-14 | Disposition: A | Discharge status: Home / Self Care | Payer: 59 | Source: Ambulatory Visit | Attending: Obstetrics and Gynecology | Admitting: Obstetrics and Gynecology

## 2019-03-14 DIAGNOSIS — Z01419 Encounter for gynecological examination (general) (routine) without abnormal findings: Secondary | ICD-10-CM | POA: Diagnosis not present

## 2019-03-14 DIAGNOSIS — Z1231 Encounter for screening mammogram for malignant neoplasm of breast: Secondary | ICD-10-CM

## 2019-03-14 DIAGNOSIS — I10 Essential (primary) hypertension: Secondary | ICD-10-CM | POA: Insufficient documentation

## 2019-03-14 DIAGNOSIS — Z6834 Body mass index (BMI) 34.0-34.9, adult: Secondary | ICD-10-CM | POA: Diagnosis not present

## 2019-03-18 MED FILL — RYBELSUS 7 MG TABS: 7 | 30 days supply | Qty: 30 | Fill #2

## 2019-04-17 MED FILL — OLMSRTN-AMLDPN-HCTZ 40-5-12: 40-5-12.5 | 30 days supply | Qty: 30 | Fill #2

## 2019-05-18 MED FILL — RYBELSUS 7 MG TABS: 7 | 30 days supply | Qty: 30 | Fill #0

## 2019-05-25 MED FILL — OLMSRTN-AMLDPN-HCTZ 40-5-12: 40-5-12.5 | 90 days supply | Qty: 90 | Fill #0

## 2019-06-19 DIAGNOSIS — E1169 Type 2 diabetes mellitus with other specified complication: Secondary | ICD-10-CM | POA: Diagnosis not present

## 2019-06-19 DIAGNOSIS — E669 Obesity, unspecified: Secondary | ICD-10-CM | POA: Diagnosis not present

## 2019-06-19 DIAGNOSIS — I10 Essential (primary) hypertension: Secondary | ICD-10-CM | POA: Diagnosis not present

## 2019-06-19 DIAGNOSIS — E785 Hyperlipidemia, unspecified: Secondary | ICD-10-CM | POA: Diagnosis not present

## 2019-06-24 DIAGNOSIS — E785 Hyperlipidemia, unspecified: Secondary | ICD-10-CM | POA: Diagnosis not present

## 2019-06-24 DIAGNOSIS — E1169 Type 2 diabetes mellitus with other specified complication: Secondary | ICD-10-CM | POA: Diagnosis not present

## 2019-06-24 DIAGNOSIS — I10 Essential (primary) hypertension: Secondary | ICD-10-CM | POA: Diagnosis not present

## 2019-07-28 ENCOUNTER — Ambulatory Visit: Payer: 59 | Attending: Internal Medicine

## 2019-07-28 DIAGNOSIS — Z23 Encounter for immunization: Secondary | ICD-10-CM

## 2019-07-28 NOTE — Progress Notes (Signed)
   Covid-19 Vaccination Clinic  Name:  Katrina Roth    MRN: [id number] DOB: January 19, 1971  07/28/2019  Katrina Roth was observed post Covid-19 immunization for 15 minutes without incident. She was provided with Vaccine Information Sheet and instruction to access the V-Safe system.   Katrina Roth was instructed to call 911 with any severe reactions post vaccine: Marland Kitchen Difficulty breathing  . Swelling of face and throat  . A fast heartbeat  . A bad rash all over body  . Dizziness and weakness   Immunizations Administered    Name Date Dose VIS Date Route   Pfizer COVID-19 Vaccine 07/28/2019  3:00 PM 0.3 mL 04/07/2019 Intramuscular   Manufacturer: [place]   Lot: [id number]   [place]: [id number]

## 2019-08-18 ENCOUNTER — Ambulatory Visit: Payer: 59 | Attending: Internal Medicine

## 2019-08-18 DIAGNOSIS — Z23 Encounter for immunization: Secondary | ICD-10-CM

## 2019-08-18 NOTE — Progress Notes (Signed)
   Covid-19 Vaccination Clinic  Name:  LAVONNE MABERRY    MRN: [id number] DOB: February 07, 1971  08/18/2019  Ms. Edmundson was observed post Covid-19 immunization for 15 minutes without incident. She was provided with Vaccine Information Sheet and instruction to access the V-Safe system.   Ms. Tonini was instructed to call 911 with any severe reactions post vaccine: Marland Kitchen Difficulty breathing  . Swelling of face and throat  . A fast heartbeat  . A bad rash all over body  . Dizziness and weakness   Immunizations Administered    Name Date Dose VIS Date Route   Pfizer COVID-19 Vaccine 08/18/2019  1:59 PM 0.3 mL 06/21/2018 Intramuscular   Manufacturer: [employer]   Lot: [id number]   [place]: [id number]

## 2019-09-14 ENCOUNTER — Encounter: Payer: Self-pay | Admitting: Nurse Practitioner

## 2019-09-14 ENCOUNTER — Other Ambulatory Visit: Payer: Self-pay

## 2019-09-14 ENCOUNTER — Ambulatory Visit: Payer: 59 | Admitting: Nurse Practitioner

## 2019-09-14 VITALS — BP 138/82 | HR 101 | Temp 97.5°F | Ht 66.0 in | Wt 218.0 lb

## 2019-09-14 DIAGNOSIS — E119 Type 2 diabetes mellitus without complications: Secondary | ICD-10-CM | POA: Diagnosis not present

## 2019-09-14 DIAGNOSIS — Z0000 Encounter for general adult medical examination without abnormal findings: Secondary | ICD-10-CM

## 2019-09-14 DIAGNOSIS — E785 Hyperlipidemia, unspecified: Secondary | ICD-10-CM

## 2019-09-14 DIAGNOSIS — E669 Obesity, unspecified: Secondary | ICD-10-CM | POA: Insufficient documentation

## 2019-09-14 DIAGNOSIS — R7303 Prediabetes: Secondary | ICD-10-CM | POA: Insufficient documentation

## 2019-09-14 DIAGNOSIS — I10 Essential (primary) hypertension: Secondary | ICD-10-CM | POA: Diagnosis not present

## 2019-09-14 DIAGNOSIS — Z114 Encounter for screening for human immunodeficiency virus [HIV]: Secondary | ICD-10-CM | POA: Diagnosis not present

## 2019-09-14 NOTE — Progress Notes (Addendum)
New Patient Office Visit  Subjective:  Patient ID: Katrina Roth, female    DOB: June 02, 1970  Age: 49 y.o. MRN: [id number]  CC:  Chief Complaint  Patient presents with  . 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 She last saw her PCP in Feb and would like to discuss HTN and requests routine labs.   Chart review: No MD notes visible in Epic. Patient brings in a copy of her lab work 06/19/2019: A1c 5.8, WBC 3.6, hemoglobin 13.7, cholesterol 205, LDL 122, HDL 62, triglycerides 100, glucose 109, BUN 12, creatinine 1.16, estimated GFR 64%,  ALT 29  Single kidney: She reports a nephrectomy at a 49 years old secondary to a genetic defect. She has seen Dr. Jacqlyn Larsen in Urology in  2006-2011 and more recently she saw a Nephrologist  in [place]- does not know the name. She wants to keep a check on her BP and  BUN/Cr.   HTN: Dx age 26 y old. She is on olmesartan-amlodipine-HCTZ 40-5-12 0.5 mg daily. She does not check her BP at home.  She denies any chest pain, pressure, palpitations, dizziness, lightheadedness, shortness of breath, DOE, or edema.  She reports she has tolerated the medication well.  T2DM: She was Dx with DM last year Sept 2020. She has been on Rybelsus 7 mg per day since Sept/Oct 2020 and has  tolerated well. She has not been on Metformin. She checks her FBS a few times a  week 88-90. No hypoglycemia.   HLD: Reports her cholesterol has been elevated and would like some more information on healthy diet.  She has treated this with diet management and red yeast rice supplement.   Mild insomnia: Melatonin works. No morning HA, fatigue, snoring, nocturnal awakening. She wakes up feeling rested.  No's formal sleep apnea testing.  Immunizations: Covid vaccines 07/28/19 and 08/18/19 Due Tdap, shingles, PNA Diet:  Trying to eat healthy Exercise: yes Colonoscopy: Neg FH cc or polyps- Due this month for first screening Dexa: No Pap Smear: Partial hysterectomy-Due Nov followed by GYN Mammogram: Due Nov Vision: No Dental: No   Past Medical History:  Diagnosis Date  . Diabetes mellitus without complication ([place])   . Hyperlipidemia   . Hypertension   . Obesity     Past Surgical History:  Procedure Laterality Date  . ABDOMINAL HYSTERECTOMY  2004   Partial  . NEPHRECTOMY  1982   Right - genetic defect per MD     Family History  Problem Relation Age of Onset  . Heart disease Mother   . ADD / ADHD Father   . Drug abuse Father   . Hypertension Sister     Social History   Socioeconomic History  . Marital status: Single    Spouse name: Not on file  . Number of children: Not on file  . Years of education: Not on file  . Highest education level: Bachelor's degree (e.g., BA, AB, BS)  Occupational History  . Occupation: Patient Accounting  Tobacco Use  . Smoking status: Never Smoker  . Smokeless tobacco: Never Used  Substance and Sexual Activity  . Alcohol use: Yes    Comment: 1 glass red wine qod  . Drug use: No  . Sexual activity: Not Currently  Other Topics Concern  .  Not on file  Social History Narrative   Single, live alone, no pets Works as a [employer] manager    Social Determinants of Radio broadcast assistant Strain:   . Difficulty of Paying Living Expenses:   Food Insecurity:   . Worried About Charity fundraiser in the Last Year:   . Arboriculturist in the Last Year:   Transportation Needs:   . Film/video editor (Medical):   Marland Kitchen Lack of Transportation (Non-Medical):   Physical Activity:   . Days of Exercise per Week:   . Minutes of Exercise per Session:   Stress:   . Feeling of Stress :   Social Connections:   . Frequency of Communication with Friends and Family:   . Frequency of Social Gatherings with Friends and Family:   . Attends Religious  Services:   . Active Member of Clubs or Organizations:   . Attends Archivist Meetings:   Marland Kitchen Marital Status:   Intimate Partner Violence:   . Fear of Current or Ex-Partner:   . Emotionally Abused:   Marland Kitchen Physically Abused:   . Sexually Abused:      Review of Systems  Constitutional: Negative for chills and fever.  HENT: Negative for congestion and sinus pressure.   Eyes: Negative.   Respiratory: Negative for cough and shortness of breath.   Cardiovascular: Negative for chest pain, palpitations and leg swelling.  Gastrointestinal: Negative for abdominal pain.  Endocrine: Negative for cold intolerance and heat intolerance.  Genitourinary: Negative for difficulty urinating, hematuria and pelvic pain.  Musculoskeletal: Negative for arthralgias and back pain.  Allergic/Immunologic: Negative.   Neurological: Negative for dizziness and headaches.  Hematological: Negative.   Psychiatric/Behavioral:       No concerns with depression/anxiety    Objective:   Today's Vitals: BP 138/82 (BP Location: Right Arm, Patient Position: Sitting, Cuff Size: Normal)   Pulse (!) 101   Temp (!) 97.5 F (36.4 C) (Skin)   Ht 5\' 6"  (1.676 m)   Wt 218 lb (98.9 kg)   SpO2 98%   BMI 35.19 kg/m   Physical Exam Vitals reviewed.  Constitutional:      Appearance: Normal appearance. She is normal weight.  HENT:     Head: Normocephalic.  Eyes:     Pupils: Pupils are equal, round, and reactive to light.  Cardiovascular:     Rate and Rhythm: Normal rate and regular rhythm.     Pulses: Normal pulses.     Heart sounds: Normal heart sounds.  Pulmonary:     Effort: Pulmonary effort is normal.     Breath sounds: Normal breath sounds.  Abdominal:     Palpations: Abdomen is soft.     Tenderness: There is no abdominal tenderness.  Musculoskeletal:        General: Normal range of motion.     Cervical back: Normal range of motion and neck supple.     Right lower leg: No edema.     Left lower leg:  No edema.  Skin:    General: Skin is warm and dry.  Neurological:     General: No focal deficit present.     Mental Status: She is alert and oriented to person, place, and time.  Psychiatric:        Mood and Affect: Mood normal.        Behavior: Behavior normal.        Thought Content: Thought content normal.  Judgment: Judgment normal.     Assessment & Plan:   Problem List Items Addressed This Visit      Cardiovascular and Mediastinum   Essential hypertension   Relevant Medications   Olmesartan-amLODIPine-HCTZ 40-5-12.5 MG TABS   Other Relevant Orders   CBC with Differential/Platelet   Comprehensive metabolic panel   Microalbumin / creatinine urine ratio     Endocrine   Controlled type 2 diabetes mellitus without complication, without long-term current use of insulin (HCC)   Relevant Medications   Olmesartan-amLODIPine-HCTZ 40-5-12.5 MG TABS   Semaglutide (RYBELSUS) 7 MG TABS   Other Relevant Orders   Ambulatory referral to Ophthalmology   Hemoglobin A1c     Other   Obesity (BMI 35.0-39.9 without comorbidity)   Relevant Medications   Semaglutide (RYBELSUS) 7 MG TABS   Other Relevant Orders   TSH   VITAMIN D 25 Hydroxy (Vit-D Deficiency, Fractures)   Hyperlipidemia   Relevant Medications   Olmesartan-amLODIPine-HCTZ 40-5-12.5 MG TABS   Other Relevant Orders   Lipid panel   Encounter for medical examination to establish care - Primary    Other Visit Diagnoses    Screening for HIV (human immunodeficiency virus)       Relevant Orders   HIV Antibody (routine testing w rflx)      Outpatient Encounter Medications as of 09/14/2019  Medication Sig  . Magnesium 250 MG TABS Take 1 tablet by mouth 3 (three) times daily.  . Melatonin 3 MG CAPS Take 3 capsules by mouth at bedtime.  . Olmesartan-amLODIPine-HCTZ 40-5-12.5 MG TABS olmesartan 40 mg-amlodipine 5 mg-hydrochlorothiazide 12.5 mg tablet  . Semaglutide (RYBELSUS) 7 MG TABS Rybelsus 7 mg tablet   1 tablet  every day by oral route.  . Cyanocobalamin (B-12) 3000 MCG CAPS Take 1 capsule by mouth 2 (two) times daily.  . [DISCONTINUED] nadolol (CORGARD) 40 MG tablet Take 40 mg by mouth daily.  . [DISCONTINUED] olmesartan-hydrochlorothiazide (BENICAR HCT) 40-25 MG per tablet Take 1 tablet by mouth daily.  . [DISCONTINUED] UNABLE TO FIND Take 12.5 mg by mouth at bedtime as needed and may repeat dose one time if needed. Med Name: EQUATE NIGHTTIME SLEEP AID  . [DISCONTINUED] UNABLE TO FIND Take 1 capsule by mouth daily. Med Name: Eitzen   No facility-administered encounter medications on file as of 09/14/2019.   Patient presents feeling well today without any specific complaints.  She would like to establish care with a local provider.   Fasting labs at your convenience.  Check BP at home OMRON arm cuff and check and bring in records. Also, please check fasting glucose and a 2 hour after meal x 3 per week.   [facility] eye exam-referral  Placed. Advised to see dentist.  A total of 40 minutes was spent with patient more than half of which was spent in counseling patient on the above mentioned issues , reviewing and explaining recent labs and discussing her BP and DM medication-Rybelsus,  healthy diet, and coordination of care.  Follow-up: Return in about 3 months (around 12/15/2019).   This visit occurred during the SARS-CoV-2 public health emergency.  Safety protocols were in place, including screening questions prior to the visit, additional usage of staff PPE, and extensive cleaning of exam room while observing appropriate contact time as indicated for disinfecting solutions.   Denice Paradise, NP

## 2019-09-14 NOTE — Patient Instructions (Addendum)
It was great to meet you.  Fasting labs at your convenience.  Check BP at home OMRON arm cuff and check and bring in records. Also, please check fasting glucose and a 2 hour after meal x 3 per week.   [facility] eye exam-referral  placed   DASH Eating Plan DASH stands for "Dietary Approaches to Stop Hypertension." The DASH eating plan is a healthy eating plan that has been shown to reduce high blood pressure (hypertension). It may also reduce your risk for type 2 diabetes, heart disease, and stroke. The DASH eating plan may also help with weight loss. What are tips for following this plan?  General guidelines  Avoid eating more than 2,300 mg (milligrams) of salt (sodium) a day. If you have hypertension, you may need to reduce your sodium intake to 1,500 mg a day.  Limit alcohol intake to no more than 1 drink a day for nonpregnant women and 2 drinks a day for men. One drink equals 12 oz of beer, 5 oz of wine, or 1 oz of hard liquor.  Work with your health care provider to maintain a healthy body weight or to lose weight. Ask what an ideal weight is for you.  Get at least 30 minutes of exercise that causes your heart to beat faster (aerobic exercise) most days of the week. Activities may include walking, swimming, or biking.  Work with your health care provider or diet and nutrition specialist (dietitian) to adjust your eating plan to your individual calorie needs. Reading food labels   Check food labels for the amount of sodium per serving. Choose foods with less than 5 percent of the Daily Value of sodium. Generally, foods with less than 300 mg of sodium per serving fit into this eating plan.  To find whole grains, look for the word "whole" as the first word in the ingredient list. Shopping  Buy products labeled as "low-sodium" or "no salt added."  Buy fresh foods. Avoid canned foods and premade or frozen meals. Cooking  Avoid adding salt when cooking. Use salt-free seasonings or  herbs instead of table salt or sea salt. Check with your health care provider or pharmacist before using salt substitutes.  Do not fry foods. Cook foods using healthy methods such as baking, boiling, grilling, and broiling instead.  Cook with heart-healthy oils, such as olive, canola, soybean, or sunflower oil. Meal planning  Eat a balanced diet that includes: ? 5 or more servings of fruits and vegetables each day. At each meal, try to fill half of your plate with fruits and vegetables. ? Up to 6-8 servings of whole grains each day. ? Less than 6 oz of lean meat, poultry, or fish each day. A 3-oz serving of meat is about the same size as a deck of cards. One egg equals 1 oz. ? 2 servings of low-fat dairy each day. ? A serving of nuts, seeds, or beans 5 times each week. ? Heart-healthy fats. Healthy fats called Omega-3 fatty acids are found in foods such as flaxseeds and coldwater fish, like sardines, salmon, and mackerel.  Limit how much you eat of the following: ? Canned or prepackaged foods. ? Food that is high in trans fat, such as fried foods. ? Food that is high in saturated fat, such as fatty meat. ? Sweets, desserts, sugary drinks, and other foods with added sugar. ? Full-fat dairy products.  Do not salt foods before eating.  Try to eat at least 2 vegetarian meals each week.  Eat more home-cooked food and less restaurant, buffet, and fast food.  When eating at a restaurant, ask that your food be prepared with less salt or no salt, if possible. What foods are recommended? The items listed may not be a complete list. Talk with your dietitian about what dietary choices are best for you. Grains Whole-grain or whole-wheat bread. Whole-grain or whole-wheat pasta. Brown rice. [place]. Bulgur. Whole-grain and low-sodium cereals. Pita bread. Low-fat, low-sodium crackers. Whole-wheat flour tortillas. Vegetables Fresh or frozen vegetables (raw, steamed, roasted, or grilled).  Low-sodium or reduced-sodium tomato and vegetable juice. Low-sodium or reduced-sodium tomato sauce and tomato paste. Low-sodium or reduced-sodium canned vegetables. Fruits All fresh, dried, or frozen fruit. Canned fruit in natural juice (without added sugar). Meat and other protein foods Skinless chicken or [place]. Ground chicken or [place]. Pork with fat trimmed off. Fish and seafood. Egg whites. Dried beans, peas, or lentils. Unsalted nuts, nut butters, and seeds. Unsalted canned beans. Lean cuts of beef with fat trimmed off. Low-sodium, lean deli meat. Dairy Low-fat (1%) or fat-free (skim) milk. Fat-free, low-fat, or reduced-fat cheeses. Nonfat, low-sodium ricotta or cottage cheese. Low-fat or nonfat yogurt. Low-fat, low-sodium cheese. Fats and oils Soft margarine without trans fats. Vegetable oil. Low-fat, reduced-fat, or light mayonnaise and salad dressings (reduced-sodium). Canola, safflower, olive, soybean, and sunflower oils. Avocado. Seasoning and other foods Herbs. Spices. Seasoning mixes without salt. Unsalted popcorn and pretzels. Fat-free sweets. What foods are not recommended? The items listed may not be a complete list. Talk with your dietitian about what dietary choices are best for you. Grains Baked goods made with fat, such as croissants, muffins, or some breads. Dry pasta or rice meal packs. Vegetables Creamed or fried vegetables. Vegetables in a cheese sauce. Regular canned vegetables (not low-sodium or reduced-sodium). Regular canned tomato sauce and paste (not low-sodium or reduced-sodium). Regular tomato and vegetable juice (not low-sodium or reduced-sodium). Angie Fava. Olives. Fruits Canned fruit in a light or heavy syrup. Fried fruit. Fruit in cream or butter sauce. Meat and other protein foods Fatty cuts of meat. Ribs. Fried meat. Berniece Salines. Sausage. Bologna and other processed lunch meats. Salami. Fatback. Hotdogs. Bratwurst. Salted nuts and seeds. Canned beans with added  salt. Canned or smoked fish. Whole eggs or egg yolks. Chicken or [place] with skin. Dairy Whole or 2% milk, cream, and half-and-half. Whole or full-fat cream cheese. Whole-fat or sweetened yogurt. Full-fat cheese. Nondairy creamers. Whipped toppings. Processed cheese and cheese spreads. Fats and oils Butter. Stick margarine. Lard. Shortening. Ghee. Bacon fat. Tropical oils, such as coconut, palm kernel, or palm oil. Seasoning and other foods Salted popcorn and pretzels. Onion salt, garlic salt, seasoned salt, table salt, and sea salt. Worcestershire sauce. Tartar sauce. Barbecue sauce. Teriyaki sauce. Soy sauce, including reduced-sodium. Steak sauce. Canned and packaged gravies. Fish sauce. Oyster sauce. Cocktail sauce. Horseradish that you find on the shelf. Ketchup. Mustard. Meat flavorings and tenderizers. Bouillon cubes. Hot sauce and Tabasco sauce. Premade or packaged marinades. Premade or packaged taco seasonings. Relishes. Regular salad dressings. Where to find more information:  National Heart, Lung, and [place]: https://wilson-eaton.com/  American Heart Association: www.heart.org Summary  The DASH eating plan is a healthy eating plan that has been shown to reduce high blood pressure (hypertension). It may also reduce your risk for type 2 diabetes, heart disease, and stroke.  With the DASH eating plan, you should limit salt (sodium) intake to 2,300 mg a day. If you have hypertension, you may need to reduce your sodium intake  to 1,500 mg a day.  When on the DASH eating plan, aim to eat more fresh fruits and vegetables, whole grains, lean proteins, low-fat dairy, and heart-healthy fats.  Work with your health care provider or diet and nutrition specialist (dietitian) to adjust your eating plan to your individual calorie needs. This information is not intended to replace advice given to you by your health care provider. Make sure you discuss any questions you have with your health care  provider. Document Revised: 03/26/2017 Document Reviewed: 04/06/2016 Elsevier Patient Education  2020 [employer].

## 2019-09-15 DIAGNOSIS — Z0000 Encounter for general adult medical examination without abnormal findings: Secondary | ICD-10-CM | POA: Insufficient documentation

## 2019-09-22 ENCOUNTER — Other Ambulatory Visit (INDEPENDENT_AMBULATORY_CARE_PROVIDER_SITE_OTHER): Payer: 59

## 2019-09-22 ENCOUNTER — Other Ambulatory Visit: Payer: Self-pay

## 2019-09-22 DIAGNOSIS — E785 Hyperlipidemia, unspecified: Secondary | ICD-10-CM

## 2019-09-22 DIAGNOSIS — I10 Essential (primary) hypertension: Secondary | ICD-10-CM | POA: Diagnosis not present

## 2019-09-22 DIAGNOSIS — Z114 Encounter for screening for human immunodeficiency virus [HIV]: Secondary | ICD-10-CM | POA: Diagnosis not present

## 2019-09-22 DIAGNOSIS — E669 Obesity, unspecified: Secondary | ICD-10-CM | POA: Diagnosis not present

## 2019-09-22 DIAGNOSIS — E119 Type 2 diabetes mellitus without complications: Secondary | ICD-10-CM | POA: Diagnosis not present

## 2019-09-22 LAB — LIPID PANEL
Cholesterol: 196 mg/dL (ref 0–200)
HDL: 61.4 mg/dL (ref >39.00)
LDL Cholesterol: 117 mg/dL — ABNORMAL HIGH (ref 0–99)
NonHDL: 134.38
Total CHOL/HDL Ratio: 3
Triglycerides: 85 mg/dL (ref 0.0–149.0)
VLDL: 17 mg/dL (ref 0.0–40.0)

## 2019-09-22 LAB — COMPREHENSIVE METABOLIC PANEL
ALT: 20 U/L (ref 0–35)
AST: 22 U/L (ref 0–37)
Albumin: 4.3 g/dL (ref 3.5–5.2)
Alkaline Phosphatase: 90 U/L (ref 39–117)
BUN: 13 mg/dL (ref 6–23)
CO2: 29 mEq/L (ref 19–32)
Calcium: 9.7 mg/dL (ref 8.4–10.5)
Chloride: 101 mEq/L (ref 96–112)
Creatinine, Ser: 1.17 mg/dL (ref 0.40–1.20)
GFR: 59.25 mL/min — ABNORMAL LOW (ref >60.00)
Glucose, Bld: 91 mg/dL (ref 70–99)
Potassium: 4.1 mEq/L (ref 3.5–5.1)
Sodium: 142 mEq/L (ref 135–145)
Total Bilirubin: 0.4 mg/dL (ref 0.2–1.2)
Total Protein: 6.9 g/dL (ref 6.0–8.3)

## 2019-09-22 LAB — CBC WITH DIFFERENTIAL/PLATELET
Basophils Absolute: 0 10*3/uL (ref 0.0–0.1)
Basophils Relative: 1 % (ref 0.0–3.0)
Eosinophils Absolute: 0.1 10*3/uL (ref 0.0–0.7)
Eosinophils Relative: 2.9 % (ref 0.0–5.0)
HCT: 42.6 % (ref 36.0–46.0)
Hemoglobin: 13.5 g/dL (ref 12.0–15.0)
Lymphocytes Relative: 32.8 % (ref 12.0–46.0)
Lymphs Abs: 1.1 10*3/uL (ref 0.7–4.0)
MCHC: 31.8 g/dL (ref 30.0–36.0)
MCV: 82.8 fl (ref 78.0–100.0)
Monocytes Absolute: 0.3 10*3/uL (ref 0.1–1.0)
Monocytes Relative: 7.8 % (ref 3.0–12.0)
Neutro Abs: 1.9 10*3/uL (ref 1.4–7.7)
Neutrophils Relative %: 55.5 % (ref 43.0–77.0)
Platelets: 218 10*3/uL (ref 150.0–400.0)
RBC: 5.14 Mil/uL — ABNORMAL HIGH (ref 3.87–5.11)
RDW: 14.6 % (ref 11.5–15.5)
WBC: 3.4 10*3/uL — ABNORMAL LOW (ref 4.0–10.5)

## 2019-09-22 LAB — VITAMIN D 25 HYDROXY (VIT D DEFICIENCY, FRACTURES): VITD: 48.7 ng/mL (ref 30.00–100.00)

## 2019-09-22 LAB — MICROALBUMIN / CREATININE URINE RATIO
Creatinine,U: 59.6 mg/dL
Microalb Creat Ratio: 1.2 mg/g (ref 0.0–30.0)
Microalb, Ur: <0.7 mg/dL (ref 0.0–1.9)

## 2019-09-22 LAB — TSH: TSH: 0.92 u[IU]/mL (ref 0.35–4.50)

## 2019-09-22 LAB — HEMOGLOBIN A1C: Hgb A1c MFr Bld: 5.8 % (ref 4.6–6.5)

## 2019-09-23 LAB — HIV ANTIBODY (ROUTINE TESTING W REFLEX): HIV 1&2 Ab, 4th Generation: NONREACTIVE

## 2019-11-15 ENCOUNTER — Encounter: Payer: Self-pay | Admitting: Nurse Practitioner

## 2019-11-15 ENCOUNTER — Other Ambulatory Visit: Payer: Self-pay

## 2019-11-15 MED ORDER — RYBELSUS 7 MG PO TABS: 1.0000 | ORAL_TABLET | Freq: Every day | ORAL | 5 refills | Status: DC

## 2019-11-15 NOTE — Progress Notes (Signed)
rx refill

## 2019-11-17 DIAGNOSIS — H524 Presbyopia: Secondary | ICD-10-CM | POA: Diagnosis not present

## 2019-11-17 LAB — HM DIABETES EYE EXAM

## 2019-12-08 ENCOUNTER — Encounter: Payer: Self-pay | Admitting: Nurse Practitioner

## 2019-12-08 ENCOUNTER — Other Ambulatory Visit: Payer: Self-pay

## 2019-12-08 MED ORDER — OLMESARTAN-AMLODIPINE-HCTZ 40-5-12.5 MG PO TABS: ORAL_TABLET | ORAL | 3 refills | Status: DC

## 2019-12-15 ENCOUNTER — Encounter: Payer: Self-pay | Admitting: Nurse Practitioner

## 2019-12-15 ENCOUNTER — Other Ambulatory Visit: Payer: Self-pay

## 2019-12-15 ENCOUNTER — Telehealth (INDEPENDENT_AMBULATORY_CARE_PROVIDER_SITE_OTHER): Payer: 59 | Admitting: Nurse Practitioner

## 2019-12-15 VITALS — BP 112/80 | HR 106 | Ht 66.0 in | Wt 218.0 lb

## 2019-12-15 DIAGNOSIS — E1169 Type 2 diabetes mellitus with other specified complication: Secondary | ICD-10-CM | POA: Diagnosis not present

## 2019-12-15 DIAGNOSIS — Z1211 Encounter for screening for malignant neoplasm of colon: Secondary | ICD-10-CM | POA: Diagnosis not present

## 2019-12-15 DIAGNOSIS — I10 Essential (primary) hypertension: Secondary | ICD-10-CM

## 2019-12-15 MED ORDER — RYBELSUS 7 MG PO TABS: 1.0000 | ORAL_TABLET | Freq: Every day | ORAL | 5 refills | Status: AC

## 2019-12-15 MED ORDER — OLMESARTAN-AMLODIPINE-HCTZ 40-5-12.5 MG PO TABS
ORAL_TABLET | ORAL | 3 refills | Status: DC
Start: 2019-12-15 — End: 2020-01-15

## 2019-12-15 NOTE — Patient Instructions (Addendum)
Continue with the current treatment plan.  Your blood pressure is at goal.  Your diabetes is at goal.  The only thing that is elevated is your weight.  Continue to work with diet and exercise as you are doing.  Check with your insurance company regarding the vaccines that are recommended for you.  This includes Tdap, shingles, flu, and pneumonia vaccines.   I have placed referral into gastroenterology for a screening colonoscopy.  When you feel comfortable, get routine dental care.  In the meantime, keep up the good work with brushing and flossing.  Follow-up with GYN in November for mammogram and pelvic exam.  Follow-up with me in November for 76-month follow-up and labs.

## 2019-12-15 NOTE — Progress Notes (Signed)
Virtual Visit via Video Note  This visit type was conducted due to national recommendations for restrictions regarding the COVID-19 pandemic (e.g. social distancing).  This format is felt to be most appropriate for this patient at this time.  All issues noted in this document were discussed and addressed.  No physical exam was performed (except for noted visual exam findings with Video Visits).   I connected with@ on 12/17/19 at  9:30 AM EDT by a video enabled telemedicine application or telephone and verified that I am speaking with the correct person using two identifiers. Location patient: home Location provider: work or home office Persons participating in the virtual visit: patient, provider  I discussed the limitations, risks, security and privacy concerns of performing an evaluation and management service by telephone and the availability of in person appointments. I also discussed with the patient that there may be a patient responsible charge related to this service. The patient expressed understanding and agreed to proceed.  Reason for visit: 3 month F/up HTN and DM  HPI: Katrina Roth has a hx of HLD, HTN, nephrectomy secondary to genetic defect, T2DM who established care in 09/14/19.    HTN: Dx at age 49 yo.  Maintained on Olmesartan-amlodipine-HCTZ 40-5-12.5. Her BP has been good 112/80 . It has been running 114/85.  She is exercising. She took magnesium this am and her BP was great. She is unaware of elevated HR and will check her BP machine readings. No CP/SOB/DOE/palpitations.    Pulse Readings from Last 3 Encounters:  12/15/19 (!) 106  09/14/19 (!) 101  11/06/16 78   T2DM: DX 12/2018 and has been on Rybelsus 7 mg daily since then. No Metformin use. Today at FBS 76 and it has been well controlled.   Lab Results  Component Value Date   HGBA1C 5.8 09/22/2019   BMI 35/Obesity/HLD: Treated with diet and red yeast rice supplement. ASCVD 10 year risk is 4%.   Lab Results  Component  Value Date   CHOL 196 09/22/2019   HDL 61.40 09/22/2019   LDLCALC 117 (H) 09/22/2019   TRIG 85.0 09/22/2019   CHOLHDL 3 09/22/2019    Wt Readings from Last 3 Encounters:  12/15/19 218 lb (98.9 kg)  09/14/19 218 lb (98.9 kg)  11/06/16 200 lb (90.7 kg)   MVI- no B12 separately  ROS: See pertinent positives and negatives per HPI.  She will check on vaccine series.  Pap/ mammogram  followed by GYN in November  Vision: yes - [facility] dilated  Dental: no  Past Medical History:  Diagnosis Date  . Diabetes mellitus without complication ([place])   . Hyperlipidemia   . Hypertension   . Obesity     Past Surgical History:  Procedure Laterality Date  . ABDOMINAL HYSTERECTOMY  2004   Partial  . NEPHRECTOMY  1982   Right - genetic defect per MD     Family History  Problem Relation Age of Onset  . Heart disease Mother   . ADD / ADHD Father   . Drug abuse Father   . Hypertension Sister     SOCIAL HX: never smoked   Current Outpatient Medications:  .  Coenzyme Q10 (COQ10) 50 MG CAPS, Take by mouth., Disp: , Rfl:  .  Cyanocobalamin (B-12) 3000 MCG CAPS, Take 1 capsule by mouth 2 (two) times daily., Disp: , Rfl:  .  Magnesium 250 MG TABS, Take 1 tablet by mouth 3 (three) times daily., Disp: , Rfl:  .  Melatonin 3  MG CAPS, Take 3 capsules by mouth at bedtime., Disp: , Rfl:  .  Olmesartan-amLODIPine-HCTZ 40-5-12.5 MG TABS, olmesartan 40 mg-amlodipine 5 mg-hydrochlorothiazide 12.5 mg tablet, Disp: 30 tablet, Rfl: 3 .  Semaglutide (RYBELSUS) 7 MG TABS, Take 1 tablet by mouth daily., Disp: 30 tablet, Rfl: 5  EXAM:  VITALS per patient if applicable:  GENERAL: alert, oriented, appears well and in no acute distress  HEENT: atraumatic, conjunctiva clear, no obvious abnormalities on inspection of external nose and ears  NECK: normal movements of the head and neck  LUNGS: on inspection no signs of respiratory distress, breathing rate appears normal, no obvious gross SOB, gasping  or wheezing  CV: no obvious cyanosis  MS: moves all visible extremities without noticeable abnormality  PSYCH/NEURO: pleasant and cooperative, no obvious depression or anxiety, speech and thought processing grossly intact  ASSESSMENT AND PLAN:  Discussed the following assessment and plan:  Type 2 diabetes mellitus with other specified complication, without long-term current use of insulin (HCC)  Screen for colon cancer - Plan: Ambulatory referral to Gastroenterology  Essential hypertension  No problem-specific Assessment & Plan notes found for this encounter. Advised:  Continue with the current treatment plan.  Your blood pressure is at goal.  Your diabetes is at goal.  The only thing that is elevated is your weight.  Continue to work with diet and exercise as you are doing.  Check with your insurance company regarding the vaccines that are recommended for you.  This includes Tdap, shingles, flu, and pneumonia vaccines.   I have placed referral into gastroenterology for a screening colonoscopy.  When you feel comfortable, get routine dental care.  In the meantime, keep up the good work with brushing and flossing.  Follow-up with GYN in November for mammogram and pelvic exam.  Follow-up with me in November for 66-month follow-up and labs.  I discussed the assessment and treatment plan with the patient. The patient was provided an opportunity to ask questions and all were answered. The patient agreed with the plan and demonstrated an understanding of the instructions.   The patient was advised to call back or seek an in-person evaluation if the symptoms worsen or if the condition fails to improve as anticipated.  I provided 25 minutes of non-face-to-face time during this encounter. Denice Paradise, NP Adult Nurse Practitioner [place] [phone]

## 2019-12-17 DIAGNOSIS — E119 Type 2 diabetes mellitus without complications: Secondary | ICD-10-CM | POA: Insufficient documentation

## 2019-12-17 DIAGNOSIS — Z1211 Encounter for screening for malignant neoplasm of colon: Secondary | ICD-10-CM | POA: Insufficient documentation

## 2020-01-15 ENCOUNTER — Encounter: Payer: Self-pay | Admitting: Nurse Practitioner

## 2020-01-15 ENCOUNTER — Other Ambulatory Visit: Payer: Self-pay

## 2020-01-15 MED ORDER — OLMESARTAN-AMLODIPINE-HCTZ 40-5-12.5 MG PO TABS: ORAL_TABLET | ORAL | 1 refills | Status: DC

## 2020-02-08 ENCOUNTER — Other Ambulatory Visit: Payer: Self-pay | Admitting: Nurse Practitioner

## 2020-02-08 DIAGNOSIS — Z0000 Encounter for general adult medical examination without abnormal findings: Secondary | ICD-10-CM

## 2020-03-14 ENCOUNTER — Telehealth: Payer: Self-pay

## 2020-03-14 DIAGNOSIS — Z6836 Body mass index (BMI) 36.0-36.9, adult: Secondary | ICD-10-CM | POA: Diagnosis not present

## 2020-03-14 DIAGNOSIS — E119 Type 2 diabetes mellitus without complications: Secondary | ICD-10-CM

## 2020-03-14 DIAGNOSIS — Z1159 Encounter for screening for other viral diseases: Secondary | ICD-10-CM

## 2020-03-14 DIAGNOSIS — Z01419 Encounter for gynecological examination (general) (routine) without abnormal findings: Secondary | ICD-10-CM | POA: Diagnosis not present

## 2020-03-14 DIAGNOSIS — E1169 Type 2 diabetes mellitus with other specified complication: Secondary | ICD-10-CM

## 2020-03-14 DIAGNOSIS — N760 Acute vaginitis: Secondary | ICD-10-CM | POA: Diagnosis not present

## 2020-03-14 DIAGNOSIS — I10 Essential (primary) hypertension: Secondary | ICD-10-CM

## 2020-03-14 NOTE — Telephone Encounter (Signed)
LMTCB to schedule lab appt.

## 2020-03-14 NOTE — Telephone Encounter (Signed)
Future CPE labs ordered.

## 2020-03-14 NOTE — Telephone Encounter (Signed)
Pt wants to have fasting labs ordered for her appt on 03/18/20.

## 2020-03-14 NOTE — Telephone Encounter (Signed)
Patient wants to have labs done before appt on 11/22; please enter labs and I will call patient to schedule appointment.

## 2020-03-15 ENCOUNTER — Other Ambulatory Visit: Payer: Self-pay

## 2020-03-15 ENCOUNTER — Ambulatory Visit
Admission: RE | Admit: 2020-03-15 | Discharge: 2020-03-15 | Disposition: A | Discharge status: Home / Self Care | Payer: 59 | Source: Ambulatory Visit | Attending: Nurse Practitioner | Admitting: Nurse Practitioner

## 2020-03-15 ENCOUNTER — Encounter: Payer: Self-pay | Admitting: Nurse Practitioner

## 2020-03-15 DIAGNOSIS — E785 Hyperlipidemia, unspecified: Secondary | ICD-10-CM

## 2020-03-15 DIAGNOSIS — Z1231 Encounter for screening mammogram for malignant neoplasm of breast: Secondary | ICD-10-CM | POA: Diagnosis not present

## 2020-03-15 DIAGNOSIS — I10 Essential (primary) hypertension: Secondary | ICD-10-CM

## 2020-03-15 DIAGNOSIS — E1169 Type 2 diabetes mellitus with other specified complication: Secondary | ICD-10-CM

## 2020-03-15 DIAGNOSIS — Z0000 Encounter for general adult medical examination without abnormal findings: Secondary | ICD-10-CM

## 2020-03-15 DIAGNOSIS — E669 Obesity, unspecified: Secondary | ICD-10-CM

## 2020-03-18 ENCOUNTER — Other Ambulatory Visit: Payer: Self-pay

## 2020-03-18 ENCOUNTER — Ambulatory Visit: Payer: 59 | Admitting: Nurse Practitioner

## 2020-03-18 ENCOUNTER — Other Ambulatory Visit: Payer: Self-pay | Admitting: Nurse Practitioner

## 2020-03-18 ENCOUNTER — Encounter: Payer: Self-pay | Admitting: Nurse Practitioner

## 2020-03-18 VITALS — BP 114/78 | HR 89 | Temp 98.3°F | Ht 66.0 in | Wt 225.0 lb

## 2020-03-18 DIAGNOSIS — L989 Disorder of the skin and subcutaneous tissue, unspecified: Secondary | ICD-10-CM

## 2020-03-18 DIAGNOSIS — Z23 Encounter for immunization: Secondary | ICD-10-CM | POA: Diagnosis not present

## 2020-03-18 DIAGNOSIS — N1831 Chronic kidney disease, stage 3a: Secondary | ICD-10-CM

## 2020-03-18 DIAGNOSIS — Z905 Acquired absence of kidney: Secondary | ICD-10-CM | POA: Diagnosis not present

## 2020-03-18 DIAGNOSIS — E1169 Type 2 diabetes mellitus with other specified complication: Secondary | ICD-10-CM | POA: Diagnosis not present

## 2020-03-18 DIAGNOSIS — E119 Type 2 diabetes mellitus without complications: Secondary | ICD-10-CM

## 2020-03-18 DIAGNOSIS — Z1159 Encounter for screening for other viral diseases: Secondary | ICD-10-CM | POA: Diagnosis not present

## 2020-03-18 DIAGNOSIS — I10 Essential (primary) hypertension: Secondary | ICD-10-CM | POA: Diagnosis not present

## 2020-03-18 DIAGNOSIS — Z1211 Encounter for screening for malignant neoplasm of colon: Secondary | ICD-10-CM

## 2020-03-18 DIAGNOSIS — E669 Obesity, unspecified: Secondary | ICD-10-CM

## 2020-03-18 LAB — CBC WITH DIFFERENTIAL/PLATELET
Basophils Absolute: 0 10*3/uL (ref 0.0–0.1)
Basophils Relative: 0.6 % (ref 0.0–3.0)
Eosinophils Absolute: 0.1 10*3/uL (ref 0.0–0.7)
Eosinophils Relative: 2.1 % (ref 0.0–5.0)
HCT: 42.9 % (ref 36.0–46.0)
Hemoglobin: 13.7 g/dL (ref 12.0–15.0)
Lymphocytes Relative: 26.5 % (ref 12.0–46.0)
Lymphs Abs: 1.1 10*3/uL (ref 0.7–4.0)
MCHC: 32 g/dL (ref 30.0–36.0)
MCV: 81.4 fl (ref 78.0–100.0)
Monocytes Absolute: 0.2 10*3/uL (ref 0.1–1.0)
Monocytes Relative: 4.6 % (ref 3.0–12.0)
Neutro Abs: 2.8 10*3/uL (ref 1.4–7.7)
Neutrophils Relative %: 66.2 % (ref 43.0–77.0)
Platelets: 264 10*3/uL (ref 150.0–400.0)
RBC: 5.27 Mil/uL — ABNORMAL HIGH (ref 3.87–5.11)
RDW: 14.4 % (ref 11.5–15.5)
WBC: 4.3 10*3/uL (ref 4.0–10.5)

## 2020-03-18 LAB — COMPREHENSIVE METABOLIC PANEL
ALT: 20 U/L (ref 0–35)
AST: 23 U/L (ref 0–37)
Albumin: 4.3 g/dL (ref 3.5–5.2)
Alkaline Phosphatase: 80 U/L (ref 39–117)
BUN: 12 mg/dL (ref 6–23)
CO2: 33 mEq/L — ABNORMAL HIGH (ref 19–32)
Calcium: 9.6 mg/dL (ref 8.4–10.5)
Chloride: 103 mEq/L (ref 96–112)
Creatinine, Ser: 1.14 mg/dL (ref 0.40–1.20)
GFR: 56.14 mL/min — ABNORMAL LOW (ref >60.00)
Glucose, Bld: 87 mg/dL (ref 70–99)
Potassium: 3.9 mEq/L (ref 3.5–5.1)
Sodium: 143 mEq/L (ref 135–145)
Total Bilirubin: 0.4 mg/dL (ref 0.2–1.2)
Total Protein: 7.1 g/dL (ref 6.0–8.3)

## 2020-03-18 LAB — LIPID PANEL
Cholesterol: 175 mg/dL (ref 0–200)
HDL: 67.4 mg/dL (ref >39.00)
LDL Cholesterol: 93 mg/dL (ref 0–99)
NonHDL: 107.88
Total CHOL/HDL Ratio: 3
Triglycerides: 74 mg/dL (ref 0.0–149.0)
VLDL: 14.8 mg/dL (ref 0.0–40.0)

## 2020-03-18 LAB — HEMOGLOBIN A1C: Hgb A1c MFr Bld: 5.8 % (ref 4.6–6.5)

## 2020-03-18 MED ORDER — OLMESARTAN-AMLODIPINE-HCTZ 40-5-12.5 MG PO TABS: ORAL_TABLET | ORAL | 1 refills | Status: DC

## 2020-03-18 MED ORDER — RYBELSUS 7 MG PO TABS: ORAL_TABLET | ORAL | 3 refills | Status: DC

## 2020-03-18 NOTE — Patient Instructions (Addendum)
Please go to the lab today.  I have placed referral to GI for colon cancer screening evaluation with colonoscopy.  I have placed referral into the [facility] group to assist with weight management, diabetes  I placed referral into [facility] for evaluation of the  black spot on the end of your left great toe.  Have refilled your routine medications for your blood pressure and diabetes.  Continue to monitor your blood pressure at home.  Continue to follow healthy diet and exercise as discussed.  Please contact your primary care provider for the late of your last tetanus vaccine.  Recommend pneumonia vaccine pneumococcal polysaccharide for high risk [id number]  Follow-up office visit in 4 months with primary care provider.   Diabetes Mellitus and Nutrition, Adult When you have diabetes (diabetes mellitus), it is very important to have healthy eating habits because your blood sugar (glucose) levels are greatly affected by what you eat and drink. Eating healthy foods in the appropriate amounts, at about the same times every day, can help you:  Control your blood glucose.  Lower your risk of heart disease.  Improve your blood pressure.  Reach or maintain a healthy weight. Every person with diabetes is different, and each person has different needs for a meal plan. Your health care provider may recommend that you work with a diet and nutrition specialist (dietitian) to make a meal plan that is best for you. Your meal plan may vary depending on factors such as:  The calories you need.  The medicines you take.  Your weight.  Your blood glucose, blood pressure, and cholesterol levels.  Your activity level.  Other health conditions you have, such as heart or kidney disease. How do carbohydrates affect me? Carbohydrates, also called carbs, affect your blood glucose level more than any other type of food. Eating carbs naturally raises the amount of glucose in your blood. Carb  counting is a method for keeping track of how many carbs you eat. Counting carbs is important to keep your blood glucose at a healthy level, especially if you use insulin or take certain oral diabetes medicines. It is important to know how many carbs you can safely have in each meal. This is different for every person. Your dietitian can help you calculate how many carbs you should have at each meal and for each snack. Foods that contain carbs include:  Bread, cereal, rice, pasta, and crackers.  Potatoes and corn.  Peas, beans, and lentils.  Milk and yogurt.  Fruit and juice.  Desserts, such as cakes, cookies, ice cream, and candy. How does alcohol affect me? Alcohol can cause a sudden decrease in blood glucose (hypoglycemia), especially if you use insulin or take certain oral diabetes medicines. Hypoglycemia can be a life-threatening condition. Symptoms of hypoglycemia (sleepiness, dizziness, and confusion) are similar to symptoms of having too much alcohol. If your health care provider says that alcohol is safe for you, follow these guidelines:  Limit alcohol intake to no more than 1 drink per day for nonpregnant women and 2 drinks per day for men. One drink equals 12 oz of beer, 5 oz of wine, or 1 oz of hard liquor.  Do not drink on an empty stomach.  Keep yourself hydrated with water, diet soda, or unsweetened iced tea.  Keep in mind that regular soda, juice, and other mixers may contain a lot of sugar and must be counted as carbs. What are tips for following this plan?  Reading food  labels  Start by checking the serving size on the "Nutrition Facts" label of packaged foods and drinks. The amount of calories, carbs, fats, and other nutrients listed on the label is based on one serving of the item. Many items contain more than one serving per package.  Check the total grams (g) of carbs in one serving. You can calculate the number of servings of carbs in one serving by dividing  the total carbs by 15. For example, if a food has 30 g of total carbs, it would be equal to 2 servings of carbs.  Check the number of grams (g) of saturated and trans fats in one serving. Choose foods that have low or no amount of these fats.  Check the number of milligrams (mg) of salt (sodium) in one serving. Most people should limit total sodium intake to less than 2,300 mg per day.  Always check the nutrition information of foods labeled as "low-fat" or "nonfat". These foods may be higher in added sugar or refined carbs and should be avoided.  Talk to your dietitian to identify your daily goals for nutrients listed on the label. Shopping  Avoid buying canned, premade, or processed foods. These foods tend to be high in fat, sodium, and added sugar.  Shop around the outside edge of the grocery store. This includes fresh fruits and vegetables, bulk grains, fresh meats, and fresh dairy. Cooking  Use low-heat cooking methods, such as baking, instead of high-heat cooking methods like deep frying.  Cook using healthy oils, such as olive, canola, or sunflower oil.  Avoid cooking with butter, cream, or high-fat meats. Meal planning  Eat meals and snacks regularly, preferably at the same times every day. Avoid going long periods of time without eating.  Eat foods high in fiber, such as fresh fruits, vegetables, beans, and whole grains. Talk to your dietitian about how many servings of carbs you can eat at each meal.  Eat 4-6 ounces (oz) of lean protein each day, such as lean meat, chicken, fish, eggs, or tofu. One oz of lean protein is equal to: ? 1 oz of meat, chicken, or fish. ? 1 egg. ?  cup of tofu.  Eat some foods each day that contain healthy fats, such as avocado, nuts, seeds, and fish. Lifestyle  Check your blood glucose regularly.  Exercise regularly as told by your health care provider. This may include: ? 150 minutes of moderate-intensity or vigorous-intensity exercise  each week. This could be brisk walking, biking, or water aerobics. ? Stretching and doing strength exercises, such as yoga or weightlifting, at least 2 times a week.  Take medicines as told by your health care provider.  Do not use any products that contain nicotine or tobacco, such as cigarettes and e-cigarettes. If you need help quitting, ask your health care provider.  Work with a Social worker or diabetes educator to identify strategies to manage stress and any emotional and social challenges. Questions to ask a health care provider  Do I need to meet with a diabetes educator?  Do I need to meet with a dietitian?  What number can I call if I have questions?  When are the best times to check my blood glucose? Where to find more information:  American Diabetes Association: diabetes.org  Academy of Nutrition and Dietetics: www.eatright.[employer] of Diabetes and Digestive and Kidney Diseases (NIH): [employer] Summary  A healthy meal plan will help you control your blood glucose and maintain a healthy lifestyle.  Working with a diet and nutrition specialist (dietitian) can help you make a meal plan that is best for you.  Keep in mind that carbohydrates (carbs) and alcohol have immediate effects on your blood glucose levels. It is important to count carbs and to use alcohol carefully. This information is not intended to replace advice given to you by your health care provider. Make sure you discuss any questions you have with your health care provider. Document Revised: 03/26/2017 Document Reviewed: 05/18/2016 Elsevier Patient Education  [place].  Obesity, Adult Obesity is the condition of having too much total body fat. Being overweight or obese means that your weight is greater than what is considered healthy for your body size. Obesity is determined by a measurement called BMI. BMI is an estimate of body fat and is calculated from height and weight.  For adults, a BMI of 30 or higher is considered obese. Obesity can lead to other health concerns and major illnesses, including:  Stroke.  Coronary artery disease (CAD).  Type 2 diabetes.  Some types of cancer, including cancers of the colon, breast, uterus, and gallbladder.  Osteoarthritis.  High blood pressure (hypertension).  High cholesterol.  Sleep apnea.  Gallbladder stones.  Infertility problems. What are the causes? Common causes of this condition include:  Eating daily meals that are high in calories, sugar, and fat.  Being born with genes that may make you more likely to become obese.  Having a medical condition that causes obesity, including: ? Hypothyroidism. ? Polycystic ovarian syndrome (PCOS). ? Binge-eating disorder. ? Cushing syndrome.  Taking certain medicines, such as steroids, antidepressants, and seizure medicines.  Not being physically active (sedentary lifestyle).  Not getting enough sleep.  Drinking high amounts of sugar-sweetened beverages, such as soft drinks. What increases the risk? The following factors may make you more likely to develop this condition:  Having a family history of obesity.  Being a woman of African American descent.  Being a man of Hispanic descent.  Living in an area with limited access to: ? [place], recreation centers, or sidewalks. ? Healthy food choices, such as grocery stores and farmers' markets. What are the signs or symptoms? The main sign of this condition is having too much body fat. How is this diagnosed? This condition is diagnosed based on:  Your BMI. If you are an adult with a BMI of 30 or higher, you are considered obese.  Your waist circumference. This measures the distance around your waistline.  Your skinfold thickness. Your health care provider may gently pinch a fold of your skin and measure it. You may have other tests to check for underlying conditions. How is this treated? Treatment  for this condition often includes changing your lifestyle. Treatment may include some or all of the following:  Dietary changes. This may include developing a healthy meal plan.  Regular physical activity. This may include activity that causes your heart to beat faster (aerobic exercise) and strength training. Work with your health care provider to design an exercise program that works for you.  Medicine to help you lose weight if you are unable to lose 1 pound a week after 6 weeks of healthy eating and more physical activity.  Treating conditions that cause the obesity (underlying conditions).  Surgery. Surgical options may include gastric banding and gastric bypass. Surgery may be done if: ? Other treatments have not helped to improve your condition. ? You have a BMI of 40 or higher. ? You have life-threatening health  problems related to obesity. Follow these instructions at home: Eating and drinking   Follow recommendations from your health care provider about what you eat and drink. Your health care provider may advise you to: ? Limit fast food, sweets, and processed snack foods. ? Choose low-fat options, such as low-fat milk instead of whole milk. ? Eat 5 or more servings of fruits or vegetables every day. ? Eat at home more often. This gives you more control over what you eat. ? Choose healthy foods when you eat out. ? Learn to read food labels. This will help you understand how much food is considered 1 serving. ? Learn what a healthy serving size is. ? Keep low-fat snacks available. ? Limit sugary drinks, such as soda, fruit juice, sweetened iced tea, and flavored milk.  Drink enough water to keep your urine pale yellow.  Do not follow a fad diet. Fad diets can be unhealthy and even dangerous. Physical activity  Exercise regularly, as told by your health care provider. ? Most adults should get up to 150 minutes of moderate-intensity exercise every week. ? Ask your health  care provider what types of exercise are safe for you and how often you should exercise.  Warm up and stretch before being active.  Cool down and stretch after being active.  Rest between periods of activity. Lifestyle  Work with your health care provider and a dietitian to set a weight-loss goal that is healthy and reasonable for you.  Limit your screen time.  Find ways to reward yourself that do not involve food.  Do not drink alcohol if: ? Your health care provider tells you not to drink. ? You are pregnant, may be pregnant, or are planning to become pregnant.  If you drink alcohol: ? Limit how much you use to:  0-1 drink a day for women.  0-2 drinks a day for men. ? Be aware of how much alcohol is in your drink. In the [place] one drink equals one 12 oz bottle of beer (355 mL), one 5 oz glass of wine (148 mL), or one 1 oz glass of hard liquor (44 mL). General instructions  Keep a weight-loss journal to keep track of the food you eat and how much exercise you get.  Take over-the-counter and prescription medicines only as told by your health care provider.  Take vitamins and supplements only as told by your health care provider.  Consider joining a support group. Your health care provider may be able to recommend a support group.  Keep all follow-up visits as told by your health care provider. This is important. Contact a health care provider if:  You are unable to meet your weight loss goal after 6 weeks of dietary and lifestyle changes. Get help right away if you are having:  Trouble breathing.  Suicidal thoughts or behaviors. Summary  Obesity is the condition of having too much total body fat.  Being overweight or obese means that your weight is greater than what is considered healthy for your body size.  Work with your health care provider and a dietitian to set a weight-loss goal that is healthy and reasonable for you.  Exercise regularly, as told by your  health care provider. Ask your health care provider what types of exercise are safe for you and how often you should exercise. This information is not intended to replace advice given to you by your health care provider. Make sure you discuss any questions you have with your  health care provider. Document Revised: 12/16/2017 Document Reviewed: 12/16/2017 Elsevier Patient Education  2020 [employer].

## 2020-03-18 NOTE — Telephone Encounter (Signed)
Patient was not able to come in for labs ahead of her appt. Patient seen this am.

## 2020-03-18 NOTE — Progress Notes (Signed)
Established Patient Office Visit  Subjective:  Patient ID: Katrina Roth, female    DOB: 1970/10/02  Age: 49 y.o. MRN: [id number]  CC:  Chief Complaint  Patient presents with  . 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    She establish care in this office in May.  Patient reports she is doing well.  Needs a GI referral for colonoscopy.  She also would like a thyroid panel with labs today.  HTN: Hypertension diagnosed age 60.  She is maintained on olmesartan amlodipine-HCTZ 40-50-12 0.5 mg tablets daily.  Blood pressure remains at excellent 114/78 range.  She denies any chest pain, pressure, heaviness, shortness of breath, or edema.  Due for repeat labs today.  BP Readings from Last 3 Encounters:  03/18/20 114/78  12/15/19 112/80  09/14/19 138/82    T2 DM without complication: T6Y has been at goal 5.8.  She was diagnosed with diabetes on 12/2018 and had was started on Rybelsus  7 mg daily.  No Metformin use.  No history of thyroid cancer, thyroid nodules, family history of thyroid cancer or neuroendocrine tumor.  She has had no hoarseness, throat swelling, difficulty swallowing.  Chronic kidney disease secondary to nephrectomy 1982 secondary to genetic defect: She saw Dr. Jacqlyn Larsen in [place]  2006- 2011 and more recently saw Nephrologist in [place] several years ago.  Does not recall the name.  She is agreeable to setting up local nephrologist for oversight.  Creatinine range 1.16.    Lab Results  Component Value Date   MICROALBUR <0.7 09/22/2019   BMI 36/obesity/HLD: Current ASCVD 10 year risk 3.4%. She is interested in [employer] Loss program.  We have placed referrals. We discussed switching from  Rybelsus to Ozempic-for the wt loss advantage. She is interested in learning more.  Cholesterol has been treated in the  past with red yeast rice supplement.  Immunizations: Covid vaccines 07/28/19 and 08/18/19. Thinks she had Tdap <10 years ago and will call her last PCP office to verify.  Declines Tdap. Needs shingles, PNA, FLU Diet: Trying to eat healthy Exercise: yes Colonoscopy: Neg FH cc or polyps- Due  first screening Dexa: No Pap Smear: Partial hysterectomy-Due Nov followed by GYN Mammogram: Due Nov 2021 followed by GYN  Vision: Yes- dilated eye exam this summer [place]: No- advised Tobacco: No   ETOH: No    Past Medical History:  Diagnosis Date  . Diabetes mellitus without complication ([place])   . Hyperlipidemia   . Hypertension   . Obesity     Past Surgical History:  Procedure Laterality Date  . ABDOMINAL HYSTERECTOMY  2004   Partial  . NEPHRECTOMY  1982   Right - genetic defect per MD     Family History  Problem Relation Age of Onset  . Heart disease Mother   . ADD / ADHD Father   . Drug abuse Father   . Hypertension Sister     Social History   Socioeconomic History  . Marital status: Single    Spouse name: Not on file  . Number of children: Not on file  . Years of education: Not on file  . Highest education level: Bachelor's degree (e.g., BA, AB, BS)  Occupational History  . Occupation: Patient Accounting  Tobacco Use  . Smoking status: Never Smoker  .  Smokeless tobacco: Never Used  Vaping Use  . Vaping Use: Never used  Substance and Sexual Activity  . Alcohol use: Yes    Comment: 1 glass red wine qod  . Drug use: No  . Sexual activity: Not Currently  Other Topics Concern  . Not on file  Social History Narrative   Single, live alone, no pets Works as a [employer] manager    Social Determinants of Radio broadcast assistant Strain:   . Difficulty of Paying Living Expenses: Not on file  Food Insecurity:   . Worried About Charity fundraiser in the Last Year: Not on file  . Ran Out of Food in the Last Year: Not on file  Transportation Needs:    . Lack of Transportation (Medical): Not on file  . Lack of Transportation (Non-Medical): Not on file  Physical Activity:   . Days of Exercise per Week: Not on file  . Minutes of Exercise per Session: Not on file  Stress:   . Feeling of Stress : Not on file  Social Connections:   . Frequency of Communication with Friends and Family: Not on file  . Frequency of Social Gatherings with Friends and Family: Not on file  . Attends Religious Services: Not on file  . Active Member of Clubs or Organizations: Not on file  . Attends Archivist Meetings: Not on file  . Marital Status: Not on file  Intimate Partner Violence:   . Fear of Current or Ex-Partner: Not on file  . Emotionally Abused: Not on file  . Physically Abused: Not on file  . Sexually Abused: Not on file    Outpatient Medications Prior to Visit  Medication Sig Dispense Refill  . Coenzyme Q10 (COQ10) 50 MG CAPS Take by mouth.    . Magnesium 250 MG TABS Take 1 tablet by mouth 3 (three) times daily.    . Melatonin 3 MG CAPS Take 3 capsules by mouth at bedtime.    . Multiple Vitamin (MULTIVITAMIN) tablet Take 1 tablet by mouth daily.    . Olmesartan-amLODIPine-HCTZ 40-5-12.5 MG TABS olmesartan 40 mg-amlodipine 5 mg-hydrochlorothiazide 12.5 mg tablet 90 tablet 1  . Semaglutide (RYBELSUS) 7 MG TABS Rybelsus 7 mg tablet    . Cyanocobalamin (B-12) 3000 MCG CAPS Take 1 capsule by mouth 2 (two) times daily.     No facility-administered medications prior to visit.    Allergies  Allergen Reactions  . Codeine   . Lactose Intolerance (Gi)   . Other     EGGPLANT    Review of Systems  Constitutional: Negative for chills and fever.  HENT: Negative.   Eyes: Negative.   Respiratory: Negative.   Cardiovascular: Negative.   Gastrointestinal: Negative.   Endocrine: Negative.   Genitourinary: Negative.   Neurological: Negative.   Hematological: Negative.   Psychiatric/Behavioral: Negative.        No concerns abut  anxiety/depression.       Objective:    Physical Exam Vitals reviewed.  Constitutional:      Appearance: She is obese.  HENT:     Head: Normocephalic and atraumatic.  Cardiovascular:     Rate and Rhythm: Normal rate and regular rhythm.     Pulses: Normal pulses.     Heart sounds: Normal heart sounds.  Pulmonary:     Effort: Pulmonary effort is normal.     Breath sounds: Normal breath sounds.  Abdominal:     Palpations: Abdomen is soft.  Tenderness: There is no abdominal tenderness.  Musculoskeletal:        General: Normal range of motion.     Cervical back: Normal range of motion.  Skin:    General: Skin is warm and dry.  Neurological:     General: No focal deficit present.     Mental Status: She is alert and oriented to person, place, and time.  Psychiatric:        Mood and Affect: Mood normal.        Behavior: Behavior normal.    BP 114/78 (BP Location: Left Arm, Patient Position: Sitting, Cuff Size: Normal)   Pulse 89   Temp 98.3 F (36.8 C) (Oral)   Ht 5\' 6"  (1.676 m)   Wt 225 lb (102.1 kg)   SpO2 99%   BMI 36.32 kg/m  Wt Readings from Last 3 Encounters:  03/18/20 225 lb (102.1 kg)  12/15/19 218 lb (98.9 kg)  09/14/19 218 lb (98.9 kg)   Pulse Readings from Last 3 Encounters:  03/18/20 89  12/15/19 (!) 106  09/14/19 (!) 101    BP Readings from Last 3 Encounters:  03/18/20 114/78  12/15/19 112/80  09/14/19 138/82    Lab Results  Component Value Date   CHOL 175 03/18/2020   HDL 67.40 03/18/2020   LDLCALC 93 03/18/2020   TRIG 74.0 03/18/2020   CHOLHDL 3 03/18/2020      Health Maintenance Due  Topic Date Due  . FOOT EXAM  Never done  . TETANUS/TDAP  Never done  . PAP SMEAR-Modifier  Never done  . COLONOSCOPY  Never done  . INFLUENZA VACCINE  11/26/2019    There are no preventive care reminders to display for this patient.  Lab Results  Component Value Date   TSH 0.92 09/22/2019   Lab Results  Component Value Date   WBC 4.3  03/18/2020   HGB 13.7 03/18/2020   HCT 42.9 03/18/2020   MCV 81.4 03/18/2020   PLT 264.0 03/18/2020   Lab Results  Component Value Date   NA 143 03/18/2020   K 3.9 03/18/2020   CO2 33 (H) 03/18/2020   GLUCOSE 87 03/18/2020   BUN 12 03/18/2020   CREATININE 1.14 03/18/2020   BILITOT 0.4 03/18/2020   ALKPHOS 80 03/18/2020   AST 23 03/18/2020   ALT 20 03/18/2020   PROT 7.1 03/18/2020   ALBUMIN 4.3 03/18/2020   CALCIUM 9.6 03/18/2020   ANIONGAP 8 11/06/2016   GFR 56.14 (L) 03/18/2020   Lab Results  Component Value Date   CHOL 175 03/18/2020   Lab Results  Component Value Date   HDL 67.40 03/18/2020   Lab Results  Component Value Date   LDLCALC 93 03/18/2020   Lab Results  Component Value Date   TRIG 74.0 03/18/2020   Lab Results  Component Value Date   CHOLHDL 3 03/18/2020   Lab Results  Component Value Date   HGBA1C 5.8 03/18/2020      Assessment & Plan:   Problem List Items Addressed This Visit      Cardiovascular and Mediastinum   Essential hypertension   Relevant Medications   Olmesartan-amLODIPine-HCTZ 40-5-12.5 MG TABS   Other Relevant Orders   Ambulatory referral to Nephrology     Endocrine   Controlled type 2 diabetes mellitus without complication, without long-term current use of insulin (HCC)   Relevant Medications   Olmesartan-amLODIPine-HCTZ 40-5-12.5 MG TABS   Semaglutide (RYBELSUS) 7 MG TABS   Other Relevant Orders   Amb  Ref to Medical Weight Management   Ambulatory referral to Nephrology   RESOLVED: Diabetes mellitus ([place])   Relevant Medications   Olmesartan-amLODIPine-HCTZ 40-5-12.5 MG TABS   Semaglutide (RYBELSUS) 7 MG TABS     Genitourinary   Stage 3a chronic kidney disease ([place])   Relevant Orders   Ambulatory referral to Nephrology     Other   Obesity (BMI 35.0-39.9 without comorbidity) - Primary   Relevant Medications   Semaglutide (RYBELSUS) 7 MG TABS   Other Relevant Orders   Amb Ref to Medical Weight Management    TSH + free T4 (Completed)    Other Visit Diagnoses    Acquired solitary kidney       Relevant Orders   Ambulatory referral to Nephrology   Colon cancer screening       Relevant Orders   Ambulatory referral to Gastroenterology   Skin lesion of foot       Relevant Orders   Ambulatory referral to Dermatology   Encounter for hepatitis C screening test for low risk patient       Relevant Orders   Hepatitis C antibody (Completed)   Need for pneumococcal vaccination       Relevant Orders   Pneumococcal polysaccharide vaccine 23-valent greater than or equal to 2yo subcutaneous/IM (Completed)      Meds ordered this encounter  Medications  . Olmesartan-amLODIPine-HCTZ 40-5-12.5 MG TABS    Sig: olmesartan 40 mg-amlodipine 5 mg-hydrochlorothiazide 12.5 mg tablet    Dispense:  90 tablet    Refill:  1    Order Specific Question:   Supervising Provider    Answer:   Einar Pheasant [id number]  . Semaglutide (RYBELSUS) 7 MG TABS    Sig: Rybelsus 7 mg tablet    Dispense:  30 tablet    Refill:  3    Order Specific Question:   Supervising Provider    Answer:   Einar Pheasant [[id number]]   Please go to the lab today.  I have placed referral to GI for colon cancer screening evaluation with colonoscopy.  I have placed referral into the [facility] weight loss group to assist with weight management, diabetes  I placed referral into [facility] [facility] for evaluation of the  black spot on the end of your left great toe.  Have refilled your routine medications for your blood pressure and diabetes.  Continue to monitor your blood pressure at home.  Continue to follow healthy diet and exercise as discussed.  Please contact your primary care provider for the late of your last tetanus vaccine.  Recommend pneumonia vaccine pneumococcal polysaccharide for high risk PPSV23   Follow-up: Return in about 4 months (around 07/16/2020).   This visit occurred during the SARS-CoV-2 public health emergency.  Safety  protocols were in place, including screening questions prior to the visit, additional usage of staff PPE, and extensive cleaning of exam room while observing appropriate contact time as indicated for disinfecting solutions.   Denice Paradise, NP

## 2020-03-19 LAB — TSH+FREE T4: TSH W/REFLEX TO FT4: 1.16 mIU/L

## 2020-03-19 LAB — HEPATITIS C ANTIBODY
Hepatitis C Ab: NONREACTIVE
SIGNAL TO CUT-OFF: 0.01 (ref <1.00)

## 2020-03-20 DIAGNOSIS — N1831 Chronic kidney disease, stage 3a: Secondary | ICD-10-CM | POA: Insufficient documentation

## 2020-03-23 ENCOUNTER — Encounter: Payer: Self-pay | Admitting: Nurse Practitioner

## 2020-03-26 ENCOUNTER — Telehealth: Payer: Self-pay

## 2020-03-26 ENCOUNTER — Telehealth (INDEPENDENT_AMBULATORY_CARE_PROVIDER_SITE_OTHER): Payer: Self-pay | Admitting: Gastroenterology

## 2020-03-26 ENCOUNTER — Other Ambulatory Visit: Payer: Self-pay

## 2020-03-26 DIAGNOSIS — Z1211 Encounter for screening for malignant neoplasm of colon: Secondary | ICD-10-CM

## 2020-03-26 NOTE — Telephone Encounter (Signed)
Patient was scheduled for 10am nurse triage call to schedule colonoscopy.  I was unable to reach her.  LVM for her to call me back today at her soonest convenience to see if I could still get her scheduled without rescheduling today's visit.  Thanks,  [place], [place]

## 2020-03-26 NOTE — Progress Notes (Signed)
Gastroenterology Pre-Procedure Review  Request Date: Monday 04/15/20 Requesting Physician: Dr. Marius Ditch  PATIENT REVIEW QUESTIONS: The patient responded to the following health history questions as indicated:    1. Are you having any GI issues? no 2. Do you have a personal history of Polyps? no 3. Do you have a family history of Colon Cancer or Polyps? no 4. Diabetes Mellitus? yes (type 2 controlled) 5. Joint replacements in the past 12 months?no 6. Major health problems in the past 3 months?no 7. Any artificial heart valves, MVP, or defibrillator?no    MEDICATIONS & ALLERGIES:    Patient reports the following regarding taking any anticoagulation/antiplatelet therapy:   Plavix, Coumadin, Eliquis, Xarelto, Lovenox, Pradaxa, Brilinta, or Effient? no Aspirin? no  Patient confirms/reports the following medications:  Current Outpatient Medications  Medication Sig Dispense Refill  . Coenzyme Q10 (COQ10) 50 MG CAPS Take by mouth.    . Magnesium 250 MG TABS Take 1 tablet by mouth 3 (three) times daily.     . Melatonin 3 MG CAPS Take 3 capsules by mouth at bedtime.    . Multiple Vitamin (MULTIVITAMIN) tablet Take 1 tablet by mouth daily.    . Olmesartan-amLODIPine-HCTZ 40-5-12.5 MG TABS olmesartan 40 mg-amlodipine 5 mg-hydrochlorothiazide 12.5 mg tablet 90 tablet 1  . Semaglutide (RYBELSUS) 7 MG TABS Rybelsus 7 mg tablet 30 tablet 3   No current facility-administered medications for this visit.    Patient confirms/reports the following allergies:  Allergies  Allergen Reactions  . Codeine   . Lactose Intolerance (Gi)   . Other     EGGPLANT    No orders of the defined types were placed in this encounter.   AUTHORIZATION INFORMATION Primary Insurance: 1D#: Group #:  Secondary Insurance: 1D#: Group #:  SCHEDULE INFORMATION: Date: Monday 04/15/20 Time: Location:[facility]

## 2020-03-29 ENCOUNTER — Telehealth: Payer: Self-pay

## 2020-03-29 NOTE — Telephone Encounter (Signed)
Returned patients call. Pt requested to cancel the procedure at this time. Pt states she will call the first of the year to reschedule.

## 2020-04-11 ENCOUNTER — Other Ambulatory Visit: Payer: Self-pay | Admitting: Emergency Medicine

## 2020-04-11 ENCOUNTER — Telehealth: Payer: 59 | Admitting: Emergency Medicine

## 2020-04-11 DIAGNOSIS — H109 Unspecified conjunctivitis: Secondary | ICD-10-CM

## 2020-04-11 MED ORDER — POLYMYXIN B-TRIMETHOPRIM 10000-0.1 UNIT/ML-% OP SOLN: 1.0000 [drp] | OPHTHALMIC | 0 refills | Status: DC

## 2020-04-11 NOTE — Progress Notes (Signed)
E-Visit for [employer]   We are sorry that you are not feeling well.  Here is how we plan to help!  Based on what you have shared with me it looks like you have conjunctivitis.  Conjunctivitis is a common inflammatory or infectious condition of the eye that is often referred to as "pink eye".  In most cases it is contagious (viral or bacterial). However, not all conjunctivitis requires antibiotics (ex. Allergic).  We have made appropriate suggestions for you based upon your presentation.  I have prescribed Polytrim Ophthalmic drops 1-2 drops 4 times a day times 5 days  Pink eye can be highly contagious.  It is typically spread through direct contact with secretions, or contaminated objects or surfaces that one may have touched.  Strict handwashing is suggested with soap and water is urged.  If not available, use alcohol based had sanitizer.  Avoid unnecessary touching of the eye.  If you wear contact lenses, you will need to refrain from wearing them until you see no white discharge from the eye for at least 24 hours after being on medication.  You should see symptom improvement in 1-2 days after starting the medication regimen.  Call us if symptoms are not improved in 1-2 days.  Home Care:  Wash your hands often!  Do not wear your contacts until you complete your treatment plan.  Avoid sharing towels, bed linen, personal items with a person who has pink eye.  See attention for anyone in your home with similar symptoms.  Get Help Right Away If:  Your symptoms do not improve.  You develop blurred or loss of vision.  Your symptoms worsen (increased discharge, pain or redness)  Your e-visit answers were reviewed by a board certified advanced clinical practitioner to complete your personal care plan.  Depending on the condition, your plan could have included both over the counter or prescription medications.  If there is a problem please reply  once you have received a response from your  provider.  Your safety is important to [place].  If you have drug allergies check your prescription carefully.    You can use MyChart to ask questions about today's visit, request a non-urgent call back, or ask for a work or school excuse for 24 hours related to this e-Visit. If it has been greater than 24 hours you will need to follow up with your provider, or enter a new e-Visit to address those concerns.   You will get an e-mail in the next two days asking about your experience.  I hope that your e-visit has been valuable and will speed your recovery. Thank you for using e-visits.     Approximately 5 minutes was used in reviewing the patient's chart, questionnaire, prescribing medications, and documentation.

## 2020-04-15 ENCOUNTER — Ambulatory Visit: Admit: 2020-04-15 | Payer: 59 | Admitting: Gastroenterology

## 2020-04-15 SURGERY — COLONOSCOPY WITH PROPOFOL
Anesthesia: General

## 2020-04-18 ENCOUNTER — Other Ambulatory Visit: Payer: Self-pay | Admitting: Nephrology

## 2020-04-18 DIAGNOSIS — E119 Type 2 diabetes mellitus without complications: Secondary | ICD-10-CM | POA: Diagnosis not present

## 2020-04-18 DIAGNOSIS — I10 Essential (primary) hypertension: Secondary | ICD-10-CM | POA: Diagnosis not present

## 2020-04-18 DIAGNOSIS — R8290 Unspecified abnormal findings in urine: Secondary | ICD-10-CM | POA: Diagnosis not present

## 2020-04-18 DIAGNOSIS — E785 Hyperlipidemia, unspecified: Secondary | ICD-10-CM

## 2020-04-18 DIAGNOSIS — N281 Cyst of kidney, acquired: Secondary | ICD-10-CM | POA: Diagnosis not present

## 2020-05-01 ENCOUNTER — Other Ambulatory Visit: Payer: Self-pay

## 2020-05-01 ENCOUNTER — Ambulatory Visit
Admission: RE | Admit: 2020-05-01 | Discharge: 2020-05-01 | Disposition: A | Discharge status: Home / Self Care | Payer: 59 | Source: Ambulatory Visit | Attending: Nephrology | Admitting: Nephrology

## 2020-05-01 DIAGNOSIS — E785 Hyperlipidemia, unspecified: Secondary | ICD-10-CM | POA: Insufficient documentation

## 2020-05-01 DIAGNOSIS — N200 Calculus of kidney: Secondary | ICD-10-CM | POA: Diagnosis not present

## 2020-05-01 DIAGNOSIS — E119 Type 2 diabetes mellitus without complications: Secondary | ICD-10-CM | POA: Insufficient documentation

## 2020-05-01 DIAGNOSIS — Z905 Acquired absence of kidney: Secondary | ICD-10-CM | POA: Diagnosis not present

## 2020-05-01 DIAGNOSIS — I10 Essential (primary) hypertension: Secondary | ICD-10-CM | POA: Diagnosis not present

## 2020-05-01 DIAGNOSIS — R8290 Unspecified abnormal findings in urine: Secondary | ICD-10-CM | POA: Insufficient documentation

## 2020-05-07 ENCOUNTER — Other Ambulatory Visit: Payer: Self-pay | Admitting: Internal Medicine

## 2020-05-07 ENCOUNTER — Ambulatory Visit: Payer: 59 | Attending: Internal Medicine

## 2020-05-07 DIAGNOSIS — Z23 Encounter for immunization: Secondary | ICD-10-CM

## 2020-05-07 NOTE — Progress Notes (Signed)
   Covid-19 Vaccination Clinic  Name:  Katrina Roth    MRN: [id number] DOB: 1970/05/03  05/07/2020  [name] was observed post Covid-19 immunization for 15 minutes without incident. She was provided with Vaccine Information Sheet and instruction to access the V-Safe system.   [name] was instructed to call 911 with any severe reactions post vaccine: Marland Kitchen Difficulty breathing  . Swelling of face and throat  . A fast heartbeat  . A bad rash all over body  . Dizziness and weakness   Immunizations Administered    Name Date Dose VIS Date Route   Pfizer COVID-19 Vaccine 05/07/2020 10:51 AM 0.3 mL 02/14/2020 Intramuscular   Manufacturer: [place]   Lot: [id number]   [place]: [id number]

## 2020-05-08 ENCOUNTER — Encounter: Payer: Self-pay | Admitting: Dietician

## 2020-05-08 ENCOUNTER — Other Ambulatory Visit: Payer: Self-pay

## 2020-05-08 ENCOUNTER — Encounter: Payer: 59 | Attending: Family | Admitting: Dietician

## 2020-05-08 VITALS — Ht 66.0 in | Wt 230.4 lb

## 2020-05-08 DIAGNOSIS — E785 Hyperlipidemia, unspecified: Secondary | ICD-10-CM

## 2020-05-08 NOTE — Progress Notes (Signed)
Medical Nutrition Therapy: Visit start time: 1610  end time: 1645  Assessment:  Diagnosis: hyperlipidemia Past medical history: HTN controlled, diabetes Psychosocial issues/ stress concerns: none  Preferred learning method:  . Auditory . Visual   Current weight: 230.4lbs Height: 5'6" Medications, supplements: reconciled list in medical record  Progress and evaluation:   Patient reports following semi-vegetarian diet (was eating 2 boiled eggs daily and some meats) for some time, and is now fully vegetarian, in effort to improve blood lipids.   Most recent labs (03/18/20): total cholesterol 175, LDL 93, HDL 67, triglycerides 74; HbA1C 5.8%  She reports feeling better with vegetarian eating, with improved BP and better GI health/ bowel regularity  Started taking red yeast rice supplement.   She has one kidney since age 50, has avoided sodas and most other beverages other than water and coffee since then.  She would like to lose weight to further reduce health risk, but this is a challenge with menopausal hormone changes, sedentary job.  Physical activity: light cardio (pedal machine) 45-60 minutes 4-5 times per week  Dietary Intake:  Usual eating pattern includes 3 meals and 1-2 snacks per day. Dining out frequency: 0 meals per week.  Snack: lemon hot water green vibrance supplement + fruit 6:30-7am Breakfast: quest bar, apple, coffee  Lunch: oatmeal with water + protein powder, plant based eggs or egg whites Snack: none Supper: 6pm  05/08/20-- butternut squash + frozen brussels sprouts, quinoa, veggie hot dog; usually plant based protein (cheese/ veggie burger/ tofu) + vegetables + whole grain Snack: occasionally quest bar if dinner inadequate Beverages: water, coffee, homemade "green" drink  Nutrition Care Education: Topics covered:  Basic nutrition: basic food groups, appropriate nutrient balance, appropriate meal and snack schedule, general nutrition guidelines    Weight  control: low sugar and low fat choices, appropriate portions, estimated energy needs for weight loss at 1500kcal, provided guidance for 51-55% CHO, 15-20% pro, 30% fat Hyperlipidemia:  target goals for lipids, healthy and unhealthy fats, role of fiber, plant sterols, role of exercise; dietary supplements; Mediterranean and vegetarian eating patterns, potentially deficient nutrients in vegetarian pattern and sources of protein, calcium, iron, B12  Nutritional Diagnosis:  Jud-2.2 Altered nutrition-related laboratory As related to history of hyperlipidemia.  As evidenced by improved total cholesterol and HDL from elevated levels 6-12 months ago. [name]-3.3 Overweight/obesity As related to history of excess calories, inadequate physical activity, hormonal changes.  As evidenced by patient with current BMI of 37, working on diet and lifestyle changes to promote weight loss.  Intervention:  . Instruction and discussion as noted above. . Patient has made lifestyle changes to improve lipids and lose weight; she is motivated to continue. . Established goals for some additional change with direction from patient.   Education Materials given:  . Plate Planner with food lists, sample meal pattern . Mediterranean diet handout . Vegetarian proteins handout . Visit summary with goals/ instructions   Learner/ who was taught:  . Patient   Level of understanding: Marland Kitchen Verbalizes/ demonstrates competency   Demonstrated degree of understanding via:   Teach back Learning barriers: . None  Willingness to learn/ readiness for change: . Eager, change in progress   Monitoring and Evaluation:  Dietary intake, exercise, blood lipids, and body weight      follow up: 06/13/20 at 3:00pm

## 2020-05-08 NOTE — Patient Instructions (Signed)
   Find healthy alternative to regular coffee creamer.   Continue with healthy food choices, great job!   Make sure to include a protein source regularly with meals.   Gradually increase intensity and/or duration of exercise.

## 2020-06-13 ENCOUNTER — Other Ambulatory Visit: Payer: Self-pay

## 2020-06-13 ENCOUNTER — Encounter: Payer: Self-pay | Admitting: Dietician

## 2020-06-13 ENCOUNTER — Encounter: Payer: 59 | Attending: Family | Admitting: Dietician

## 2020-06-13 VITALS — Ht 66.0 in | Wt 231.9 lb

## 2020-06-13 DIAGNOSIS — Z6837 Body mass index (BMI) 37.0-37.9, adult: Secondary | ICD-10-CM

## 2020-06-13 DIAGNOSIS — E785 Hyperlipidemia, unspecified: Secondary | ICD-10-CM

## 2020-06-13 DIAGNOSIS — E669 Obesity, unspecified: Secondary | ICD-10-CM | POA: Insufficient documentation

## 2020-06-13 NOTE — Patient Instructions (Signed)
   Goal for saturated fat for lowering cholesterol is 10-12grams daily or less.   Continue to consider options for meal prepping, batch cooking, to save time and energy later.   Keep up the great, healthy food choices and vegetarian proteins

## 2020-06-13 NOTE — Progress Notes (Signed)
Medical Nutrition Therapy: Visit start time: 1500  end time: 1540  Assessment:  Diagnosis: hyperlipidemia, obesity Medical history changes: no changes Psychosocial issues/ stress concerns: none  Current weight: 231.9lbs Height: 5'6" Medications, supplement changes: no changes  Progress and evaluation:  . Patient continues with vegetarian eating pattern; she is including protein sources with meals and snacks. . She reports she has not yet changed coffee creamer to healthier alternative, but has some guilt feelings when she adds creamer to her coffee. She does enjoy this particular creamer and her morning coffee. . She reports adding some strength training and flexibility exercise, and is working to increase intensity of cardiovascular exercise. . She has avoided restaurant eating since pandemic began.   Physical activity: walking, pedalling + stretching/ strengthening, working on some dance for exercise  4-5 times a week, 45-60 minutes  Dietary Intake:  Usual eating pattern includes 3 meals and 2-3 snacks per day. Dining out frequency: 0 meals per week.  Snack: green vibrance vitamin drink mix with hot water and lemon + fruit Breakfast: Quest bar, apple, coffee Lunch: cereal with chia, occ adds cranberries and/or almonds; plans on trying meal replacement shake (Quest) with peanut powder Snack: almonds, small portion, occ with cranberries Supper: squash with brussels sprouts or broccoli + lentils -- first try today; veggie burger with hummus + veg Snack: occ 1/2 quest bar Beverages: water, coffee, green vibrance drink  Nutrition Care Education: Topics covered:  Weight control: reviewed progress since previous visit; focus on healthy lifestyle vs calorie counting and scale results Advanced nutrition:  cooking techniques to avoid daily cooking/ save time -- ie meal prepping, batch cooking Hyperlipidemia: goal for saturated fat intake limit; role of fiber, healthy food choices for protein,  controlled fat; benefit of including some healthy fats Other: allowance for small treats ie coffee creamer, especially when other food choices are low in unhealthy fats and sugar.    Nutritional Diagnosis:  [place]-2.2 Altered nutrition-related laboratory As related to hyperlipidemia.  As evidenced by history of elevated LDL. [place]-3.3 Overweight/obesity As related to history of excess calories and inadequate physical activity.  As evidenced by patient with current BMI of 37, following low calorie vegetarian eating pattern to promote weight loss.  Intervention:  . Instruction and discussion as noted above. . Commended patient for ongoing effort to consume a balanced, heart healthy diet.  Marland Kitchen She will continue with current eating pattern and increase label reading for saturated fat. . She plans to incorporate cooking strategies to save time and increase variety of vegetarian meal options.  Education Materials given:  Marland Kitchen Visit summary with goals/ instructions   Learner/ who was taught:  . Patient   Level of understanding: Marland Kitchen Verbalizes/ demonstrates competency   Demonstrated degree of understanding via:   Teach back Learning barriers: . None  Willingness to learn/ readiness for change: . Eager, change in progress   Monitoring and Evaluation:  Dietary intake, exercise, blood lipids, and body weight      follow up: 07/11/20 at 3:00pm

## 2020-07-11 ENCOUNTER — Other Ambulatory Visit: Payer: Self-pay

## 2020-07-11 ENCOUNTER — Encounter: Payer: Self-pay | Admitting: Dietician

## 2020-07-11 ENCOUNTER — Encounter: Payer: 59 | Attending: Family | Admitting: Dietician

## 2020-07-11 VITALS — Ht 66.0 in | Wt 231.4 lb

## 2020-07-11 DIAGNOSIS — Z6837 Body mass index (BMI) 37.0-37.9, adult: Secondary | ICD-10-CM | POA: Insufficient documentation

## 2020-07-11 DIAGNOSIS — E669 Obesity, unspecified: Secondary | ICD-10-CM | POA: Diagnosis not present

## 2020-07-11 DIAGNOSIS — E785 Hyperlipidemia, unspecified: Secondary | ICD-10-CM | POA: Insufficient documentation

## 2020-07-11 NOTE — Patient Instructions (Signed)
   Continue with current eating pattern and regular exercise, awesome job!

## 2020-07-11 NOTE — Progress Notes (Signed)
Medical Nutrition Therapy: Visit start time: 1500  end time: 1530  Assessment:  Diagnosis: hyperlipidemia Medical history changes: no changes Psychosocial issues/ stress concerns: none  Current weight: 231.4lbs  Height: 5'6" Medications, supplement changes: no changes  Progress and evaluation:  . Patient has been incorporating more beans/ lentils into meals, in creative ways and is enjoying this eating pattern. . She voices concern over previous (2020) HbA1C test, as she was told she had diabetes; more recent tests are in pre-diabetes range. This is of particular concern, as a diagnosis of diabetes along with having 1 kidney would pose greater health risk.  . She is planning to check into additional means for helping with some weight loss.   Physical activity: recently obtained elliptical machine overall 45-60 minutes, 4-5 times a week  Dietary Intake:  Usual eating pattern includes 3 meals and 2-3 snacks per day. Dining out frequency: 0 meals per week.  Snack: green vibrance vitamin drink Breakfast: Quest bar and fruit, coffee; occ beans/lentils  Lunch: beans with veggie dog Snack: nuts and fruit or quest bar Supper: veg with lentils/ beans; winter variety squash or sweet potato ie with spinach and beans; chickpea salad Snack: same as pm or none Beverages: water, "green vibrance" vitamin drink, coffee  Nutrition Care Education: Topics covered:     Weight control: monitoring food portions and overall caloric intake; effects of exercise and muscle development Hyperlipidemia:  target goals for lipids, health value of current eating pattern Diabetes prevention: HbA1C normal, pre-diabetes, and diabetes levels; effects of exercise and balanced eating pattern on reducing diabetes risk.  Nutritional Diagnosis:  [place]-2.2 Altered nutrition-related laboratory As related to hyperlipidemia.  As evidenced by history of elevated LDL. [place]-3.3 Overweight/obesity As related to history of excess  calories and inadequate physical activity.  As evidenced by patient with current BMI of 37, following low calorie vegetarian eating pattern to promote weight loss.  Intervention:  . Instruction and discussion as noted above. . Patient continues to follow healthy vegetarian eating pattern and is finding a variety of foods she enjoys.  . No additional change needed at this time.  . Patient will continue to focus some effort on weight loss.  . MNT visits have been completed, no further follow-up scheduled at this time. Patient will make contact if any questions or concerns arise.   Learner/ who was taught:  . Patient   Level of understanding: Marland Kitchen Verbalizes/ demonstrates competency   Demonstrated degree of understanding via:   Teach back Learning barriers: . None  Willingness to learn/ readiness for change: . Eager, change in progress   Monitoring and Evaluation:  Dietary intake, exercise, blood lipids, and body weight      follow up: prn

## 2020-07-30 ENCOUNTER — Other Ambulatory Visit: Payer: Self-pay

## 2020-08-23 ENCOUNTER — Other Ambulatory Visit: Payer: Self-pay

## 2020-08-23 MED FILL — Olmesartan-Amlodipine-Hydrochlorothiazide Tab 40-5-12.5 MG: ORAL | 30 days supply | Qty: 30 | Fill #0 | Status: AC

## 2020-08-29 ENCOUNTER — Ambulatory Visit: Payer: 59 | Admitting: Dermatology

## 2020-09-24 ENCOUNTER — Other Ambulatory Visit: Payer: Self-pay

## 2020-09-24 ENCOUNTER — Ambulatory Visit: Payer: 59 | Admitting: Family

## 2020-09-24 ENCOUNTER — Encounter: Payer: Self-pay | Admitting: Family

## 2020-09-24 VITALS — BP 132/96 | HR 101 | Temp 96.9°F | Resp 15 | Ht 66.0 in | Wt 230.6 lb

## 2020-09-24 DIAGNOSIS — R519 Headache, unspecified: Secondary | ICD-10-CM | POA: Diagnosis not present

## 2020-09-24 DIAGNOSIS — I10 Essential (primary) hypertension: Secondary | ICD-10-CM

## 2020-09-24 LAB — HEMOGLOBIN A1C: Hgb A1c MFr Bld: 6.4 % (ref 4.6–6.5)

## 2020-09-24 LAB — CBC WITH DIFFERENTIAL/PLATELET
Basophils Absolute: 0 10*3/uL (ref 0.0–0.1)
Basophils Relative: 0.4 % (ref 0.0–3.0)
Eosinophils Absolute: 0.1 10*3/uL (ref 0.0–0.7)
Eosinophils Relative: 2.3 % (ref 0.0–5.0)
HCT: 41.1 % (ref 36.0–46.0)
Hemoglobin: 13.5 g/dL (ref 12.0–15.0)
Lymphocytes Relative: 23.7 % (ref 12.0–46.0)
Lymphs Abs: 1.3 10*3/uL (ref 0.7–4.0)
MCHC: 32.7 g/dL (ref 30.0–36.0)
MCV: 79.8 fl (ref 78.0–100.0)
Monocytes Absolute: 0.3 10*3/uL (ref 0.1–1.0)
Monocytes Relative: 6.4 % (ref 3.0–12.0)
Neutro Abs: 3.7 10*3/uL (ref 1.4–7.7)
Neutrophils Relative %: 67.2 % (ref 43.0–77.0)
Platelets: 243 10*3/uL (ref 150.0–400.0)
RBC: 5.15 Mil/uL — ABNORMAL HIGH (ref 3.87–5.11)
RDW: 14.9 % (ref 11.5–15.5)
WBC: 5.4 10*3/uL (ref 4.0–10.5)

## 2020-09-24 LAB — COMPREHENSIVE METABOLIC PANEL
ALT: 12 U/L (ref 0–35)
AST: 17 U/L (ref 0–37)
Albumin: 4.4 g/dL (ref 3.5–5.2)
Alkaline Phosphatase: 83 U/L (ref 39–117)
BUN: 18 mg/dL (ref 6–23)
CO2: 30 mEq/L (ref 19–32)
Calcium: 9.8 mg/dL (ref 8.4–10.5)
Chloride: 101 mEq/L (ref 96–112)
Creatinine, Ser: 1.13 mg/dL (ref 0.40–1.20)
GFR: 56.53 mL/min — ABNORMAL LOW (ref >60.00)
Glucose, Bld: 107 mg/dL — ABNORMAL HIGH (ref 70–99)
Potassium: 3.5 mEq/L (ref 3.5–5.1)
Sodium: 142 mEq/L (ref 135–145)
Total Bilirubin: 0.4 mg/dL (ref 0.2–1.2)
Total Protein: 7.1 g/dL (ref 6.0–8.3)

## 2020-09-24 LAB — MICROALBUMIN / CREATININE URINE RATIO
Creatinine,U: 45 mg/dL
Microalb Creat Ratio: 1.6 mg/g (ref 0.0–30.0)
Microalb, Ur: <0.7 mg/dL (ref 0.0–1.9)

## 2020-09-24 MED ORDER — OLMESARTAN MEDOXOMIL-HCTZ 40-12.5 MG PO TABS
1.0000 | ORAL_TABLET | Freq: Every day | ORAL | 1 refills | Status: DC
  Filled 2020-09-24: qty 90, 90d supply, fill #0
  Filled 2020-12-27: qty 90, 90d supply, fill #1

## 2020-09-24 MED ORDER — AMLODIPINE BESYLATE 10 MG PO TABS
10.0000 mg | ORAL_TABLET | Freq: Every day | ORAL | 1 refills | Status: DC
  Filled 2020-09-24: qty 90, 90d supply, fill #0
  Filled 2020-12-27: qty 90, 90d supply, fill #1

## 2020-09-24 NOTE — Patient Instructions (Addendum)
Please call Dr Lanora Manis to make follow up as due this month Please also call to make appointment for colonoscopy Referral to pulmonology Let us know if you dont hear back within a week in regards to an appointment being scheduled.    Start amlodipine 10mg    It is imperative that you are seen AT least twice per year for labs and monitoring. Monitor blood pressure at home and me 5-6 reading on separate days. Goal is less than 120/80, based on newest guidelines, however we certainly want to be less than 130/80;  if persistently higher, please make sooner follow up appointment so we can recheck you blood pressure and manage/ adjust medications.

## 2020-09-24 NOTE — Progress Notes (Signed)
Subjective:    Patient ID: Katrina Roth, female    DOB: Sep 25, 1970, 50 y.o.   MRN: [id number]  CC: Katrina Roth is a 50 y.o. female who presents today for follow up and to establish care.   HPI:  Complains of frontal HA, 2x per week for several weeks. No ha today. Eating and peppermint oil will help. Drinks 1 cup coffee per day. No nsaids. Snores. HA is not HA of life. HA doesn't awake from sleep, with valsava.  H/o migraines. No nausea, vomiting, vision changes, confusion, numbness to face, arms.    HTN- compliant with olmesartan, hctz and amlodipine. No cp.    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  She plans to follow up with Dr Lanora Manis.     Colonoscopy; she has phone number to call and plans to call.   Mammogram UTD [facility] in [place] NP Pap smear 02/2020 normal per patient HISTORY:  Past Medical History:  Diagnosis Date  . Diabetes mellitus without complication ([place])   . Hyperlipidemia   . Hypertension   . Obesity    Past Surgical History:  Procedure Laterality Date  . ABDOMINAL HYSTERECTOMY  2004   Partial  . NEPHRECTOMY  1982   Right - genetic defect per MD.    Family History  Problem Relation Age of Onset  . Heart disease Mother   . ADD / ADHD Father   . Drug abuse Father   . Hypertension Sister     Allergies: Codeine, Lactose intolerance (gi), and Other Current Outpatient Medications on File Prior to Visit  Medication Sig Dispense Refill  . GNP GARLIC EXTRACT PO Take by mouth.    . Melatonin 3 MG CAPS Take 3 capsules by mouth at bedtime.    . Multiple Vitamin (MULTIVITAMIN) tablet Take 1 tablet by mouth daily.    . NON FORMULARY Chlorella green algae supplement    . NON FORMULARY Green Vibrance probiotic supplement    . Omega 3 1200 MG CAPS Take by mouth.    . Red Yeast Rice Extract (RED YEAST RICE PO) Take by mouth.    . Semaglutide 7 MG TABS TAKE 1 TABLET BY MOUTH DAILY. 30 tablet 5   No  current facility-administered medications on file prior to visit.    Social History   Tobacco Use  . Smoking status: Never Smoker  . Smokeless tobacco: Never Used  Vaping Use  . Vaping Use: Never used  Substance Use Topics  . Alcohol use: Not Currently  . Drug use: No    Review of Systems  Constitutional: Negative for chills and fever.  Eyes: Negative for visual disturbance.  Respiratory: Negative for cough.   Cardiovascular: Negative for chest pain and palpitations.  Gastrointestinal: Negative for nausea and vomiting.  Neurological: Positive for headaches. Negative for dizziness.      Objective:    BP (!) 132/96 (BP Location: Left Arm, Patient Position: Sitting, Cuff Size: Large)   Pulse (!) 101   Temp (!) 96.9 F (36.1 C) (Temporal)   Resp 15   Ht 5\' 6"  (1.676 m)   Wt 230 lb 9.6 oz (104.6 kg)   SpO2 97%   BMI 37.22 kg/m  BP Readings from Last 3 Encounters:  09/24/20 (!) 132/96  03/18/20 114/78  12/15/19 112/80   Wt Readings from Last 3 Encounters:  09/24/20 230 lb 9.6 oz (104.6 kg)  07/11/20 231 lb 6.4 oz (105 kg)  06/13/20 231 lb  14.4 oz (105.2 kg)    Physical Exam Vitals reviewed.  Constitutional:      Appearance: She is well-developed.  HENT:     Mouth/Throat:     Pharynx: Uvula midline.  Eyes:     Conjunctiva/sclera: Conjunctivae normal.     Pupils: Pupils are equal, round, and reactive to light.     Comments: Fundus normal bilaterally.   Cardiovascular:     Rate and Rhythm: Normal rate and regular rhythm.     Pulses: Normal pulses.     Heart sounds: Normal heart sounds.  Pulmonary:     Effort: Pulmonary effort is normal.     Breath sounds: Normal breath sounds. No wheezing, rhonchi or rales.  Skin:    General: Skin is warm and dry.  Neurological:     Mental Status: She is alert.     Cranial Nerves: No cranial nerve deficit.     Sensory: No sensory deficit.     Deep Tendon Reflexes:     Reflex Scores:      Bicep reflexes are 2+ on the  right side and 2+ on the left side.      Patellar reflexes are 2+ on the right side and 2+ on the left side.    Comments: Grip equal and strong bilateral upper extremities. Gait strong and steady. Able to perform rapid alternating movement without difficulty.   Psychiatric:        Speech: Speech normal.        Behavior: Behavior normal.        Thought Content: Thought content normal.        Assessment & Plan:   Problem List Items Addressed This Visit      Cardiovascular and Mediastinum   Essential hypertension - Primary    Uncontrolled. Start amlodipine 10mg . Continue olmesartan 40mg  , hctz 12.5mg       Relevant Medications   olmesartan-hydrochlorothiazide (BENICAR HCT) 40-12.5 MG tablet   amLODipine (NORVASC) 10 MG tablet   Other Relevant Orders   Comprehensive metabolic panel (Completed)   CBC with Differential/Platelet (Completed)   Hemoglobin A1c (Completed)   Microalbumin / creatinine urine ratio (Completed)   Ambulatory referral to Pulmonology     Other   Headache    Reassuring neurologic exam. No alarm features as this time. We agreed evaluation for OSA and to optimize blood pressure control as most important. Close follow up.       Relevant Medications   amLODipine (NORVASC) 10 MG tablet       I have discontinued Katrina Roth's COVID-19 mRNA vaccine [employer]), trimethoprim-polymyxin b, and Olmesartan-amLODIPine-HCTZ. I am also having her start on olmesartan-hydrochlorothiazide and amLODipine. Additionally, I am having her maintain her Melatonin, multivitamin, Omega 3, GNP GARLIC EXTRACT PO, Red Yeast Rice Extract (RED YEAST RICE PO), NON FORMULARY, NON FORMULARY, and Semaglutide.   Meds ordered this encounter  Medications  . olmesartan-hydrochlorothiazide (BENICAR HCT) 40-12.5 MG tablet    Sig: Take 1 tablet by mouth daily.    Dispense:  90 tablet    Refill:  1    Order Specific Question:   Supervising Provider    Answer:   Deborra Medina L [[id number]]  .  amLODipine (NORVASC) 10 MG tablet    Sig: Take 1 tablet (10 mg total) by mouth daily.    Dispense:  90 tablet    Refill:  1    Order Specific Question:   Supervising Provider    Answer:   Deborra Medina L [[id number]]  Return precautions given.   Risks, benefits, and alternatives of the medications and treatment plan prescribed today were discussed, and patient expressed understanding.   Education regarding symptom management and diagnosis given to patient on AVS.  Continue to follow with Burnard Hawthorne, FNP for routine health maintenance.   Suzy Bouchard and I agreed with plan.   Mable Paris, FNP

## 2020-09-25 ENCOUNTER — Telehealth: Payer: Self-pay | Admitting: Gastroenterology

## 2020-09-25 ENCOUNTER — Other Ambulatory Visit: Payer: Self-pay

## 2020-09-25 DIAGNOSIS — R519 Headache, unspecified: Secondary | ICD-10-CM | POA: Insufficient documentation

## 2020-09-25 DIAGNOSIS — Z1211 Encounter for screening for malignant neoplasm of colon: Secondary | ICD-10-CM

## 2020-09-25 MED ORDER — NA SULFATE-K SULFATE-MG SULF 17.5-3.13-1.6 GM/177ML PO SOLN
354.0000 mL | Freq: Once | ORAL | 0 refills | Status: AC
  Filled 2020-09-25: qty 354, 1d supply, fill #0

## 2020-09-25 NOTE — Telephone Encounter (Signed)
Patient called and is ready to r/s her procedure with Dr. Marius Ditch.

## 2020-09-25 NOTE — Assessment & Plan Note (Signed)
Uncontrolled. Start amlodipine 10mg . Continue olmesartan 40mg  , hctz 12.5mg 

## 2020-09-25 NOTE — Assessment & Plan Note (Signed)
Reassuring neurologic exam. No alarm features as this time. We agreed evaluation for OSA and to optimize blood pressure control as most important. Close follow up.

## 2020-09-25 NOTE — Telephone Encounter (Signed)
Scheduled patient for 10/31/2020 with Dr. Marius Ditch at [facility]. Went over instructions with patient. Sent to [employer] and mailed to patient. Sent prep to the pharmacy

## 2020-10-17 DIAGNOSIS — E119 Type 2 diabetes mellitus without complications: Secondary | ICD-10-CM | POA: Diagnosis not present

## 2020-10-17 DIAGNOSIS — Z9889 Other specified postprocedural states: Secondary | ICD-10-CM | POA: Diagnosis not present

## 2020-10-17 DIAGNOSIS — R808 Other proteinuria: Secondary | ICD-10-CM | POA: Diagnosis not present

## 2020-10-17 DIAGNOSIS — I10 Essential (primary) hypertension: Secondary | ICD-10-CM | POA: Diagnosis not present

## 2020-10-17 DIAGNOSIS — E785 Hyperlipidemia, unspecified: Secondary | ICD-10-CM | POA: Diagnosis not present

## 2020-10-17 DIAGNOSIS — Z905 Acquired absence of kidney: Secondary | ICD-10-CM | POA: Diagnosis not present

## 2020-10-17 DIAGNOSIS — E663 Overweight: Secondary | ICD-10-CM | POA: Diagnosis not present

## 2020-10-17 DIAGNOSIS — N1831 Chronic kidney disease, stage 3a: Secondary | ICD-10-CM | POA: Diagnosis not present

## 2020-10-17 DIAGNOSIS — N200 Calculus of kidney: Secondary | ICD-10-CM | POA: Diagnosis not present

## 2020-10-31 ENCOUNTER — Other Ambulatory Visit: Payer: Self-pay

## 2020-10-31 ENCOUNTER — Encounter
Admission: RE | Disposition: A | Discharge status: Home / Self Care | Payer: Self-pay | Source: Home / Self Care | Attending: Gastroenterology

## 2020-10-31 ENCOUNTER — Ambulatory Visit
Admission: RE | Admit: 2020-10-31 | Discharge: 2020-10-31 | Disposition: A | Discharge status: Home / Self Care | Payer: 59 | Attending: Gastroenterology | Admitting: Gastroenterology

## 2020-10-31 ENCOUNTER — Ambulatory Visit: Payer: 59 | Admitting: Registered Nurse

## 2020-10-31 ENCOUNTER — Encounter: Payer: Self-pay | Admitting: Gastroenterology

## 2020-10-31 DIAGNOSIS — K635 Polyp of colon: Secondary | ICD-10-CM

## 2020-10-31 DIAGNOSIS — Z1211 Encounter for screening for malignant neoplasm of colon: Secondary | ICD-10-CM | POA: Diagnosis not present

## 2020-10-31 DIAGNOSIS — Z91011 Allergy to milk products: Secondary | ICD-10-CM | POA: Diagnosis not present

## 2020-10-31 DIAGNOSIS — Z79899 Other long term (current) drug therapy: Secondary | ICD-10-CM | POA: Diagnosis not present

## 2020-10-31 DIAGNOSIS — Z885 Allergy status to narcotic agent status: Secondary | ICD-10-CM | POA: Diagnosis not present

## 2020-10-31 DIAGNOSIS — Z905 Acquired absence of kidney: Secondary | ICD-10-CM | POA: Diagnosis not present

## 2020-10-31 DIAGNOSIS — K5730 Diverticulosis of large intestine without perforation or abscess without bleeding: Secondary | ICD-10-CM | POA: Insufficient documentation

## 2020-10-31 DIAGNOSIS — D123 Benign neoplasm of transverse colon: Secondary | ICD-10-CM | POA: Diagnosis not present

## 2020-10-31 DIAGNOSIS — K5790 Diverticulosis of intestine, part unspecified, without perforation or abscess without bleeding: Secondary | ICD-10-CM | POA: Diagnosis not present

## 2020-10-31 HISTORY — PX: COLONOSCOPY WITH PROPOFOL: SHX5780

## 2020-10-31 LAB — GLUCOSE, CAPILLARY: Glucose-Capillary: 84 mg/dL (ref 70–99)

## 2020-10-31 SURGERY — COLONOSCOPY WITH PROPOFOL
Anesthesia: General

## 2020-10-31 MED ORDER — LIDOCAINE HCL (PF) 2 % IJ SOLN
INTRAMUSCULAR | Status: AC
  Filled 2020-10-31: qty 5

## 2020-10-31 MED ORDER — SODIUM CHLORIDE 0.9 % IV SOLN
INTRAVENOUS | Status: DC
Start: 2020-10-31 — End: 2020-10-31

## 2020-10-31 MED ORDER — DEXMEDETOMIDINE (PRECEDEX) IN NS 20 MCG/5ML (4 MCG/ML) IV SYRINGE
PREFILLED_SYRINGE | INTRAVENOUS | Status: DC | PRN
  Administered 2020-10-31: 4 ug via INTRAVENOUS
  Administered 2020-10-31: 16 ug via INTRAVENOUS

## 2020-10-31 MED ORDER — LIDOCAINE HCL (CARDIAC) PF 100 MG/5ML IV SOSY
PREFILLED_SYRINGE | INTRAVENOUS | Status: DC | PRN
  Administered 2020-10-31: 40 mg via INTRAVENOUS

## 2020-10-31 MED ORDER — PROPOFOL 10 MG/ML IV BOLUS
INTRAVENOUS | Status: DC | PRN
  Administered 2020-10-31: 10 mg via INTRAVENOUS
  Administered 2020-10-31: 90 mg via INTRAVENOUS

## 2020-10-31 MED ORDER — PROPOFOL 500 MG/50ML IV EMUL
INTRAVENOUS | Status: AC
  Filled 2020-10-31: qty 50

## 2020-10-31 MED ORDER — PROPOFOL 500 MG/50ML IV EMUL
INTRAVENOUS | Status: DC | PRN
  Administered 2020-10-31: 150 ug/kg/min via INTRAVENOUS

## 2020-10-31 NOTE — Anesthesia Preprocedure Evaluation (Signed)
Anesthesia Evaluation  Patient identified by MRN, date of birth, ID band Patient awake    Reviewed: Allergy & Precautions, H&P , NPO status , Patient's Chart, lab work & pertinent test results, reviewed documented beta blocker date and time   History of Anesthesia Complications Negative for: history of anesthetic complications  Airway Mallampati: III  TM Distance: >3 FB Neck ROM: full    Dental  (+) Dental Advidsory Given, Teeth Intact, Chipped   Pulmonary neg pulmonary ROS,    Pulmonary exam normal breath sounds clear to auscultation       Cardiovascular Exercise Tolerance: Good hypertension, (-) angina(-) Past MI and (-) Cardiac Stents Normal cardiovascular exam(-) dysrhythmias (-) Valvular Problems/Murmurs Rhythm:regular Rate:Normal     Neuro/Psych negative neurological ROS  negative psych ROS   GI/Hepatic negative GI ROS, Neg liver ROS,   Endo/Other  diabetes (borderline)  Renal/GU CRFRenal disease (s/p nephrectomy at age 50 and kidney stone)  negative genitourinary   Musculoskeletal   Abdominal   Peds  Hematology negative hematology ROS (+)   Anesthesia Other Findings Past Medical History: No date: Diabetes mellitus without complication (H[facility]) No date: Hyperlipidemia No date: Hypertension No date: Obesity   Reproductive/Obstetrics negative OB ROS                             Anesthesia Physical Anesthesia Plan  ASA: 2  Anesthesia Plan: General   Post-op Pain Management:    Induction: Intravenous  PONV Risk Score and Plan: TIVA and Propofol infusion  Airway Management Planned: Natural Airway and Nasal Cannula  Additional Equipment:   Intra-op Plan:   Post-operative Plan:   Informed Consent: I have reviewed the patients History and Physical, chart, labs and discussed the procedure including the risks, benefits and alternatives for the proposed anesthesia with the  patient or authorized representative who has indicated his/her understanding and acceptance.     Dental Advisory Given  Plan Discussed with: Anesthesiologist, CRNA and Surgeon  Anesthesia Plan Comments:         Anesthesia Quick Evaluation

## 2020-10-31 NOTE — Anesthesia Postprocedure Evaluation (Signed)
Anesthesia Post Note  Patient: Katrina Roth  Procedure(s) Performed: COLONOSCOPY WITH PROPOFOL  Patient location during evaluation: Endoscopy Anesthesia Type: General Level of consciousness: awake and alert Pain management: pain level controlled Vital Signs Assessment: post-procedure vital signs reviewed and stable Respiratory status: spontaneous breathing, nonlabored ventilation, respiratory function stable and patient connected to nasal cannula oxygen Cardiovascular status: blood pressure returned to baseline and stable Postop Assessment: no apparent nausea or vomiting Anesthetic complications: no   No notable events documented.   Last Vitals:  Vitals:   10/31/20 0910 10/31/20 0920  BP: 119/60 113/78  Pulse:  80  Resp: 12 12  Temp:    SpO2: 99% 99%    Last Pain:  Vitals:   10/31/20 0840  TempSrc: Temporal  PainSc:                  Martha Clan

## 2020-10-31 NOTE — Anesthesia Procedure Notes (Signed)
Date/Time: 10/31/2020 8:20 AM Performed by: Doreen Salvage, CRNA Pre-anesthesia Checklist: Patient identified, Emergency Drugs available, Suction available and Patient being monitored Patient Re-evaluated:Patient Re-evaluated prior to induction Oxygen Delivery Method: Nasal cannula Induction Type: IV induction Dental Injury: Teeth and Oropharynx as per pre-operative assessment  Comments: Nasal cannula with etCO2 monitoring

## 2020-10-31 NOTE — H&P (Signed)
Katrina Darby, MD [address]  [place]  [place],  [address]  Main: [phone]  Fax: [phone] Pager: [phone]  Primary Care Physician:  Burnard Hawthorne, FNP Primary Gastroenterologist:  Dr. Cephas Roth  Pre-Procedure History & Physical: HPI:  Katrina Roth is a 50 y.o. female is here for an colonoscopy.   Past Medical History:  Diagnosis Date   Diabetes mellitus without complication ([place])    Hyperlipidemia    Hypertension    Obesity     Past Surgical History:  Procedure Laterality Date   ABDOMINAL HYSTERECTOMY  2004   Partial   NEPHRECTOMY  1982   Right - genetic defect per MD.     Prior to Admission medications   Medication Sig Start Date End Date Taking? Authorizing Provider  amLODipine (NORVASC) 10 MG tablet Take 1 tablet (10 mg total) by mouth daily. 09/24/20  Yes Arnett, Yvetta Coder, FNP  GNP GARLIC EXTRACT PO Take by mouth.   Yes [provider]  Melatonin 3 MG CAPS Take 3 capsules by mouth at bedtime.   Yes [provider]  Multiple Vitamin (MULTIVITAMIN) tablet Take 1 tablet by mouth daily.   Yes [provider]  NON FORMULARY Chlorella green algae supplement   Yes [provider]  NON FORMULARY Green Vibrance probiotic supplement   Yes [provider]  olmesartan-hydrochlorothiazide (BENICAR HCT) 40-12.5 MG tablet Take 1 tablet by mouth daily. 09/24/20  Yes Burnard Hawthorne, FNP  Omega 3 1200 MG CAPS Take by mouth.   Yes [provider]  Red Yeast Rice Extract (RED YEAST RICE PO) Take by mouth.   Yes [provider]  Semaglutide 7 MG TABS TAKE 1 TABLET BY MOUTH DAILY. 11/15/19 11/14/20 Yes Marval Regal, NP    Allergies as of 09/25/2020 - Review Complete 09/24/2020  Allergen Reaction Noted   Codeine  09/14/2019   Lactose intolerance (gi)  05/19/2012   Other  05/21/2012    Family History  Problem Relation Age of Onset   Heart disease Mother    ADD / ADHD  Father    Drug abuse Father    Hypertension Sister     Social History   Socioeconomic History   Marital status: Single    Spouse name: Not on file   Number of children: Not on file   Years of education: Not on file   Highest education level: Bachelor's degree (e.g., BA, AB, BS)  Occupational History   Occupation: Patient Accounting  Tobacco Use   Smoking status: Never   Smokeless tobacco: Never  Vaping Use   Vaping Use: Never used  Substance and Sexual Activity   Alcohol use: Not Currently   Drug use: No   Sexual activity: Not Currently  Other Topics Concern   Not on file  Social History Narrative   From [place] [employer] [place], live alone, no pets       BS degree       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 Neck:  Supple; no masses or thyromegaly. Lungs:  Clear throughout to auscultation.    Heart:  Regular rate and rhythm. Abdomen:  Soft, nontender and nondistended. Normal bowel sounds, without guarding, and without rebound.   Neurologic:  Alert and  oriented x4;  grossly normal neurologically.  Impression/Plan: Katrina Roth is here for an colonoscopy to be performed for colon cancer screening  Risks, benefits, limitations, and alternatives regarding  colonoscopy have been reviewed with the patient.  Questions have been answered.  All parties agreeable.   Sherri Sear, MD  10/31/2020, 8:11 AM

## 2020-10-31 NOTE — Transfer of Care (Signed)
Immediate Anesthesia Transfer of Care Note  Patient: Katrina Roth  Procedure(s) Performed: COLONOSCOPY WITH PROPOFOL  Patient Location: PACU and Endoscopy Unit  Anesthesia Type:General  Level of Consciousness: awake  Airway & Oxygen Therapy: Patient Spontanous Breathing  Post-op Assessment: Report given to RN  Post vital signs: Reviewed and stable  Last Vitals:  Vitals Value Taken Time  BP 105/68 10/31/20 0850  Temp    Pulse 93 10/31/20 0850  Resp 16 10/31/20 0850  SpO2 97 % 10/31/20 0850    Last Pain:  Vitals:   10/31/20 0840  TempSrc: Temporal  PainSc:          Complications: No notable events documented.

## 2020-10-31 NOTE — Op Note (Signed)
[facility]enter[facility] Patient Name: Katrina Roth Procedure Date: 10/31/2020 8:18 AM MRN: [id number] Account #: [id number] Date of Birth: January 19, 1971 Admit Type: Outpatient Age: 50 Room: [facility] ENDO ROOM 4 Gender: Female Note Status: Finalized Procedure:             Colonoscopy Indications:           Screening for colorectal malignant neoplasm, This is                         the patient's first colonoscopy Providers:             Lin Landsman MD, MD Referring MD:          Yvetta Coder. Arnett (Referring MD) Medicines:             General Anesthesia Complications:         No immediate complications. Estimated blood loss: None. Procedure:             Pre-Anesthesia Assessment:                        - Prior to the procedure, a History and Physical was                         performed, and patient medications and allergies were                         reviewed. The patient is competent. The risks and                         benefits of the procedure and the sedation options and                         risks were discussed with the patient. All questions                         were answered and informed consent was obtained.                         Patient identification and proposed procedure were                         verified by the physician, the nurse, the                         anesthesiologist, the anesthetist and the technician                         in the pre-procedure area in the procedure room in the                         endoscopy suite. Mental Status Examination: alert and                         oriented. Airway Examination: normal oropharyngeal                         airway and neck mobility. Respiratory Examination:  clear to auscultation. CV Examination: normal.                         Prophylactic Antibiotics: The patient does not require                         prophylactic antibiotics. Prior Anticoagulants:  The                         patient has taken no previous anticoagulant or                         antiplatelet agents. ASA Grade Assessment: II - A                         patient with mild systemic disease. After reviewing                         the risks and benefits, the patient was deemed in                         satisfactory condition to undergo the procedure. The                         anesthesia plan was to use general anesthesia.                         Immediately prior to administration of medications,                         the patient was re-assessed for adequacy to receive                         sedatives. The heart rate, respiratory rate, oxygen                         saturations, blood pressure, adequacy of pulmonary                         ventilation, and response to care were monitored                         throughout the procedure. The physical status of the                         patient was re-assessed after the procedure.                        After obtaining informed consent, the colonoscope was                         passed under direct vision. Throughout the procedure,                         the patient's blood pressure, pulse, and oxygen                         saturations were monitored continuously. The  Colonoscope was introduced through the anus and                         advanced to the the cecum, identified by appendiceal                         orifice and ileocecal valve. The colonoscopy was                         technically difficult and complex due to significant                         looping. Successful completion of the procedure was                         aided by applying abdominal pressure. The patient                         tolerated the procedure well. The quality of the bowel                         preparation was evaluated using the BBPS [facility] Bowel                         Preparation Scale) with  scores of: Right Colon = 3                         (entire mucosa seen well with no residual staining,                         small fragments of stool or opaque liquid), Transverse                         Colon = 3 (entire mucosa seen well with no residual                         staining, small fragments of stool or opaque liquid)                         and Left Colon = 2 (minor amount of residual staining,                         small fragments of stool and/or opaque liquid, but                         mucosa seen well). The total BBPS score equals 8. Findings:      The perianal and digital rectal examinations were normal. Pertinent       negatives include normal sphincter tone and no palpable rectal lesions.      A 7 mm polyp was found in the transverse colon. The polyp was sessile.       The polyp was removed with a cold snare. Resection and retrieval were       complete.      A few diverticula were found in the sigmoid colon and ascending colon.      The retroflexed view of the distal rectum and anal verge was normal  and       showed no anal or rectal abnormalities. Impression:            - One 7 mm polyp in the transverse colon, removed with                         a cold snare. Resected and retrieved.                        - Diverticulosis in the sigmoid colon and in the                         ascending colon.                        - The distal rectum and anal verge are normal on                         retroflexion view. Recommendation:        - Discharge patient to home (with escort).                        - Resume previous diet today.                        - Continue present medications.                        - Await pathology results.                        - Repeat colonoscopy in 5 years for surveillance. Procedure Code(s):     --- Professional ---                        [phone], Colonoscopy, flexible; with removal of                         tumor(s), polyp(s), or  other lesion(s) by snare                         technique Diagnosis Code(s):     --- Professional ---                        Z12.11, Encounter for screening for malignant neoplasm                         of colon                        K63.5, Polyp of colon                        K57.30, Diverticulosis of large intestine without                         perforation or abscess without bleeding CPT copyright 2019 American Medical Association. All rights reserved. The codes documented in this report are preliminary and upon coder review may  be revised to meet current compliance requirements. Dr. Ulyess Mort Lin Landsman MD, MD 10/31/2020 8:46:55 AM This report has  been signed electronically. Number of Addenda: 0 Note Initiated On: 10/31/2020 8:18 AM Scope Withdrawal Time: 0 hours 10 minutes 24 seconds  Total Procedure Duration: 0 hours 19 minutes 39 seconds  Estimated Blood Loss:  Estimated blood loss: none.      [facility]

## 2020-11-01 ENCOUNTER — Encounter: Payer: Self-pay | Admitting: Gastroenterology

## 2020-11-01 ENCOUNTER — Other Ambulatory Visit (HOSPITAL_COMMUNITY): Payer: Self-pay

## 2020-11-01 LAB — SURGICAL PATHOLOGY

## 2020-11-04 ENCOUNTER — Encounter: Payer: Self-pay | Admitting: Gastroenterology

## 2020-11-05 ENCOUNTER — Other Ambulatory Visit (HOSPITAL_COMMUNITY): Payer: Self-pay

## 2020-11-19 ENCOUNTER — Other Ambulatory Visit: Payer: Self-pay

## 2020-11-19 ENCOUNTER — Ambulatory Visit: Payer: 59 | Admitting: Adult Health

## 2020-11-19 ENCOUNTER — Encounter: Payer: Self-pay | Admitting: Adult Health

## 2020-11-19 VITALS — BP 130/82 | HR 99 | Temp 98.0°F | Ht 66.0 in | Wt 227.6 lb

## 2020-11-19 DIAGNOSIS — R0683 Snoring: Secondary | ICD-10-CM

## 2020-11-19 DIAGNOSIS — E669 Obesity, unspecified: Secondary | ICD-10-CM | POA: Diagnosis not present

## 2020-11-19 NOTE — Assessment & Plan Note (Signed)
Snoring, restless sleep, gasping episodes during her sleep are all suspicious for underlying sleep apnea.  Set patient up for home sleep study.  Patient education given on sleep apnea and potential treatments including weight loss, oral appliance and CPAP.  Cardiovascular risk factors were discussed.  Plan  Patient Instructions  Set up for Home sleep study .  Healthy sleep regimen  Follow up in 6 weeks with virtual or in person office visit

## 2020-11-19 NOTE — Patient Instructions (Signed)
Set up for Home sleep study .  Healthy sleep regimen  Follow up in 6 weeks with virtual or in person office visit

## 2020-11-19 NOTE — Progress Notes (Signed)
$'@Patient'g$  ID: Katrina Roth, female    DOB: 09/08/1970, 50 y.o.   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  Patient states over the last 3 years she has noticed a change in her sleep regimen.  She feels that she is not sleeping properly.  She complains of snoring episodes of struggling and gasping for air during her sleep.  She wakes up in the morning and has a sore dry throat.  She does not feel refreshed when upon awakening.  She typically goes to bed around 9:51 PM takes her about 45 minutes to go to sleep and gets up 1-2 times at night.  She also gets up at 5:55 AM in the morning.  Patient says she is very tired by the end of the day.  Epworth score is 4.  Patient's weight has trended up over the last 2 to 3 years around 10 pounds.  Patient says she tries to be active, exercises around 2-3 times a week but low intensity.  Patient takes melatonin and magnesium to help with sleep.  Patient gets in about 1 cup of caffeine daily.  Denies any energy drinks or energy pills.  No symptoms suspicious for cataplexy or sleepwalking.  Denies any teeth grinding.    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  She works remotely from home.  She is single.  Denies any alcohol or smoking.  Family history is significant for mother with congestive heart failure.  Her mother died in Jul 27, 2016.  Surgical history significant for right nephrectomy at age 42 from genetic defect.  Counseling given: Not Answered   Outpatient Medications Prior to Visit  Medication Sig Dispense Refill   amLODipine (NORVASC) 10 MG tablet Take 1 tablet (10 mg total) by mouth daily. 90 tablet 1   amLODipine (NORVASC) 10 MG tablet Take 1 tablet by mouth daily.     GNP GARLIC EXTRACT PO Take by mouth.     Multiple Vitamin (MULTIVITAMIN) tablet Take 1 tablet by mouth daily.     NON FORMULARY Chlorella green algae supplement     NON FORMULARY Green Vibrance probiotic supplement     olmesartan-hydrochlorothiazide (BENICAR HCT) 40-12.5 MG tablet Take 1 tablet by mouth daily. 90 tablet 1   Omega 3 1200 MG CAPS Take by mouth.     Red Yeast Rice Extract (RED YEAST RICE PO) Take by mouth.     Melatonin 3 MG CAPS Take 3 capsules by mouth at bedtime.     No facility-administered medications prior to  visit.     Review of Systems:   Constitutional:   No  weight loss, night sweats,  Fevers, chills,  +fatigue, or  lassitude.  HEENT:   No headaches,  Difficulty swallowing,  Tooth/dental problems, or  Sore throat,                No sneezing, itching, ear ache, nasal congestion, post nasal drip,   CV:  No chest pain,  Orthopnea, PND, swelling in lower extremities, anasarca, dizziness, palpitations, syncope.   GI  No heartburn, indigestion, abdominal pain, nausea, vomiting, diarrhea, change in bowel habits, loss of appetite, bloody stools.   Resp: No shortness of breath with exertion or at rest.  No excess mucus, no productive cough,  No non-productive cough,  No coughing  up of blood.  No change in color of mucus.  No wheezing.  No chest wall deformity  Skin: no rash or lesions.  GU: no dysuria, change in color of urine, no urgency or frequency.  No flank pain, no hematuria   MS:  No joint pain or swelling.  No decreased range of motion.  No back pain.    Physical Exam  BP 130/82 (BP Location: Left Arm, Patient Position: Sitting, Cuff Size: Normal)   Pulse 99   Temp 98 F (36.7 C) (Oral)   Ht '5\' 6"'$  (1.676 m)   Wt 227 lb 9.6 oz (103.2 kg)   SpO2 97%   BMI 36.74 kg/m   GEN: A/Ox3; pleasant , NAD, well nourished    HEENT:  [place]/AT,  EACs-clear, TMs-wnl, NOSE-clear, THROAT-clear, no lesions, no postnasal drip or exudate noted. Class 3 MP airway   NECK:  Supple w/ fair ROM; no JVD; normal carotid impulses w/o bruits; no thyromegaly or nodules palpated; no lymphadenopathy.    RESP  Clear  P & A; w/o, wheezes/ rales/ or rhonchi. no accessory muscle use, no dullness to percussion  CARD:  RRR, no m/r/g, no peripheral edema, pulses intact, no cyanosis or clubbing.  GI:   Soft & nt; nml bowel sounds; no organomegaly or masses detected.   Musco: Warm bil, no deformities or joint swelling noted.   Neuro: alert, no focal deficits noted.    Skin: Warm, no lesions or rashes    Lab Results:  CBC    Component Value Date/Time   WBC 5.4 09/24/2020 1101   RBC 5.15 (H) 09/24/2020 1101   HGB 13.5 09/24/2020 1101   HCT 41.1 09/24/2020 1101   PLT 243.0 09/24/2020 1101   MCV 79.8 09/24/2020 1101   MCH 26.3 11/06/2016 0548   MCHC 32.7 09/24/2020 1101   RDW 14.9 09/24/2020 1101   LYMPHSABS 1.3 09/24/2020 1101   MONOABS 0.3 09/24/2020 1101   EOSABS 0.1 09/24/2020 1101   BASOSABS 0.0 09/24/2020 1101    BMET    Component Value Date/Time   NA 142 09/24/2020 1101   K 3.5 09/24/2020 1101   CL 101 09/24/2020 1101   CO2 30 09/24/2020 1101   GLUCOSE 107 (H) 09/24/2020 1101   BUN 18 09/24/2020 1101   CREATININE 1.13 09/24/2020 1101   CALCIUM 9.8  09/24/2020 1101   GFRNONAA 56 (L) 11/06/2016 0548   GFRAA >60 11/06/2016 0548    BNP No results found for: BNP  ProBNP No results found for: PROBNP  Imaging: No results found.    No flowsheet data found.  No results found for: NITRICOXIDE      Assessment & Plan:   Snoring Snoring, restless sleep, gasping  episodes during her sleep are all suspicious for underlying sleep apnea.  Set patient up for home sleep study.  Patient education given on sleep apnea and potential treatments including weight loss, oral appliance and CPAP.  Cardiovascular risk factors were discussed.  Plan  Patient Instructions  Set up for Home sleep study .  Healthy sleep regimen  Follow up in 6 weeks with virtual or in person office visit      Obesity (BMI 35.0-39.9 without comorbidity) Discussed healthy weight loss.     Rexene Edison, NP 11/19/2020

## 2020-11-19 NOTE — Assessment & Plan Note (Signed)
Discussed healthy weight loss.

## 2020-11-25 ENCOUNTER — Ambulatory Visit: Payer: 59 | Admitting: Family

## 2020-11-25 ENCOUNTER — Other Ambulatory Visit: Payer: Self-pay

## 2020-11-25 ENCOUNTER — Encounter: Payer: Self-pay | Admitting: Family

## 2020-11-25 VITALS — BP 110/80 | HR 105 | Temp 98.4°F | Ht 66.0 in | Wt 225.2 lb

## 2020-11-25 DIAGNOSIS — I10 Essential (primary) hypertension: Secondary | ICD-10-CM | POA: Diagnosis not present

## 2020-11-25 DIAGNOSIS — Z23 Encounter for immunization: Secondary | ICD-10-CM | POA: Diagnosis not present

## 2020-11-25 DIAGNOSIS — R7303 Prediabetes: Secondary | ICD-10-CM

## 2020-11-25 DIAGNOSIS — N1831 Chronic kidney disease, stage 3a: Secondary | ICD-10-CM

## 2020-11-25 MED ORDER — RYBELSUS 7 MG PO TABS: 7.0000 mg | ORAL_TABLET | Freq: Every day | ORAL | 1 refills | Status: DC

## 2020-11-25 NOTE — Assessment & Plan Note (Signed)
Lab Results  Component Value Date   HGBA1C 6.4 09/24/2020   Patient has lost 6 pounds.  Congratulated patient on her weight loss.  We jointly agreed to continue Rybelsus 7 mg.  I encouraged her to take 30 minutes prior to meal with a glass of water on an empty stomach.  She will try this and see if it is more effective.  We discussed increasing Rybelsus in the future if she would like.  Will follow.

## 2020-11-25 NOTE — Progress Notes (Signed)
Reviewed and agree with assessment/plan.   Chesley Mires, MD [facility] Pulmonary/Critical Care 11/25/2020, 8:23 AM Pager:  [phone]

## 2020-11-25 NOTE — Progress Notes (Signed)
Subjective:    Patient ID: Katrina Roth, female    DOB: [birth year], 50 y.o.   MRN: [id number]  CC: Katrina Roth is a 50 y.o. female who presents today for follow up.   HPI: Feels well today.  No new complaints   hypertension-compliant with amlodipine 10 mg, olmesartan 40 mg, hydrochlorothiazide 12.5 mg   She remains prediabetic. She is compliant with rybelusus '7mg'$ . Occasional constipation. She takes rybelsus a couple of hours prior to meals.   she had a consult with pulmonology last week in regards to evaluation for sleep apnea.  Pending home sleep study. Colonoscopy 10/31/2020, Dr. Marius Ditch .repeat in 5 years time    follow-up with nephrology 10/17/2020 for stage III kidney disease with proteinuria.  Repeat renal ultrasound in 6 months time.     HISTORY:  Past Medical History:  Diagnosis Date   Diabetes mellitus without complication ([place])    Hyperlipidemia    Hypertension    Obesity    Past Surgical History:  Procedure Laterality Date   ABDOMINAL HYSTERECTOMY  2004   Partial   COLONOSCOPY WITH PROPOFOL N/A 10/31/2020   Procedure: COLONOSCOPY WITH PROPOFOL;  Surgeon: Lin Landsman, MD;  Location: [facility] ENDOSCOPY;  Service: Gastroenterology;  Laterality: N/A;   NEPHRECTOMY  1982   Right - genetic defect per MD.    Family History  Problem Relation Age of Onset   Heart disease Mother    ADD / ADHD Father    Drug abuse Father    Hypertension Sister     Allergies: Codeine, Lactose intolerance (gi), and Other Current Outpatient Medications on File Prior to Visit  Medication Sig Dispense Refill   amLODipine (NORVASC) 10 MG tablet Take 1 tablet by mouth daily.     GNP GARLIC EXTRACT PO Take by mouth.     Multiple Vitamin (MULTIVITAMIN) tablet Take 1 tablet by mouth daily.     NON FORMULARY Chlorella green algae supplement     NON FORMULARY Green Vibrance probiotic supplement     olmesartan-hydrochlorothiazide (BENICAR HCT) 40-12.5 MG tablet Take 1 tablet by mouth  daily. 90 tablet 1   Omega 3 1200 MG CAPS Take by mouth.     Red Yeast Rice Extract (RED YEAST RICE PO) Take by mouth.     No current facility-administered medications on file prior to visit.    Social History   Tobacco Use   Smoking status: Never   Smokeless tobacco: Never  Vaping Use   Vaping Use: Never used  Substance Use Topics   Alcohol use: Not Currently   Drug use: No    Review of Systems  Constitutional:  Negative for chills and fever.  Respiratory:  Negative for cough.   Cardiovascular:  Negative for chest pain and palpitations.  Gastrointestinal:  Negative for nausea and vomiting.     Objective:    BP 110/80 (BP Location: Left Arm, Patient Position: Sitting, Cuff Size: Large)   Pulse (!) 105   Temp 98.4 F (36.9 C) (Oral)   Ht '5\' 6"'$  (1.676 m)   Wt 225 lb 3.2 oz (102.2 kg)   SpO2 99%   BMI 36.35 kg/m  BP Readings from Last 3 Encounters:  11/25/20 110/80  11/19/20 130/82  10/31/20 113/78   Wt Readings from Last 3 Encounters:  11/25/20 225 lb 3.2 oz (102.2 kg)  11/19/20 227 lb 9.6 oz (103.2 kg)  10/31/20 230 lb (104.3 kg)    Physical Exam Vitals reviewed.  Constitutional:  Appearance: She is well-developed.  Eyes:     Conjunctiva/sclera: Conjunctivae normal.  Neck:     Thyroid: No thyroid mass or thyromegaly.  Cardiovascular:     Rate and Rhythm: Normal rate and regular rhythm.     Pulses: Normal pulses.     Heart sounds: Normal heart sounds.  Pulmonary:     Effort: Pulmonary effort is normal.     Breath sounds: Normal breath sounds. No wheezing, rhonchi or rales.  Lymphadenopathy:     Head:     Right side of head: No submental, submandibular, tonsillar, preauricular, posterior auricular or occipital adenopathy.     Left side of head: No submental, submandibular, tonsillar, preauricular, posterior auricular or occipital adenopathy.     Cervical: No cervical adenopathy.  Skin:    General: Skin is warm and dry.  Neurological:     Mental  Status: She is alert.  Psychiatric:        Speech: Speech normal.        Behavior: Behavior normal.        Thought Content: Thought content normal.       Assessment & Plan:   Problem List Items Addressed This Visit       Cardiovascular and Mediastinum   Essential hypertension    Improved, continue amlodipine '10mg'$ , olmesartan 40 mg, hydrochlorothiazide 12.5          Genitourinary   Stage 3a chronic kidney disease (HCC)    Chronic, stable.  She is following with nephrology, will follow         Other   Prediabetes - Primary    Lab Results  Component Value Date   HGBA1C 6.4 09/24/2020  Patient has lost 6 pounds.  Congratulated patient on her weight loss.  We jointly agreed to continue Rybelsus 7 mg.  I encouraged her to take 30 minutes prior to meal with a glass of water on an empty stomach.  She will try this and see if it is more effective.  We discussed increasing Rybelsus in the future if she would like.  Will follow.      Relevant Medications   Semaglutide (RYBELSUS) 7 MG TABS   Other Visit Diagnoses     Need for Tdap vaccination       Relevant Orders   Tdap vaccine greater than or equal to 7yo IM (Completed)        I have discontinued Jadelin M. Kondo's Melatonin. I am also having her start on Rybelsus. Additionally, I am having her maintain her multivitamin, Omega 3, GNP GARLIC EXTRACT PO, Red Yeast Rice Extract (RED YEAST RICE PO), NON FORMULARY, NON FORMULARY, olmesartan-hydrochlorothiazide, and amLODipine.   Meds ordered this encounter  Medications   Semaglutide (RYBELSUS) 7 MG TABS    Sig: Take 7 mg by mouth daily.    Dispense:  90 tablet    Refill:  1    Order Specific Question:   Supervising Provider    Answer:   Crecencio Mc [[id number]]     Return precautions given.   Risks, benefits, and alternatives of the medications and treatment plan prescribed today were discussed, and patient expressed understanding.   Education regarding symptom  management and diagnosis given to patient on AVS.  Continue to follow with Burnard Hawthorne, FNP for routine health maintenance.   Suzy Bouchard and I agreed with plan.   Mable Paris, FNP

## 2020-11-25 NOTE — Assessment & Plan Note (Signed)
Chronic, stable.  She is following with nephrology, will follow

## 2020-11-25 NOTE — Assessment & Plan Note (Signed)
Improved, continue amlodipine '10mg'$ , olmesartan 40 mg, hydrochlorothiazide 12.5

## 2020-11-25 NOTE — Patient Instructions (Addendum)
Continue rybelsus '7mg'$ .  Also, with the Rybelsus, be very sure that you take on an empty stomach with a little bit of water and take 30 minutes before eating. Do not take with other medicines and no other food with medication. This is important for effectiveness.   Remember black box warning that you may not take this medication if you or a family member is diagnosed with thyroid cancer.    Semaglutide Oral Tablets What is this medicine? SEMAGLUTIDE (Sem a GLOO tide) controls blood sugar in people with type 2 diabetes. It is used with lifestyle changes like diet and exercise. This medicine may be used for other purposes; ask your health care provider or pharmacist if you have questions. COMMON BRAND NAME(S): Rybelsus What should I tell my health care provider before I take this medicine? They need to know if you have any of these conditions: endocrine tumors (MEN 2) or if someone in your family had these tumors eye disease history of pancreatitis kidney disease stomach or intestine problems thyroid cancer or if someone in your family had thyroid cancer vision problems an unusual or allergic reaction to semaglutide, other medicines, foods, dyes, or preservatives pregnant or trying to get pregnant breast-feeding How should I use this medicine? Take this medicine by mouth. Take it as directed on the prescription label at the same time every day. Take the dose right after waking up. Do not eat or drink anything before taking it. Do not take it with any other drink except a glass of plain water that is less than 4 ounces (less than 120 mL). Do not cut, crush or chew this medicine. Swallow the tablets whole. After taking it, do not eat breakfast, drink, or take any other medicines or vitamins for at least 30 minutes. Keep taking it unless your health care provider tells you to stop. A special MedGuide will be given to you by the pharmacist with each prescription and refill. Be sure to read this  information carefully each time. Talk to your health care provider about the use of this medicine in children. Special care may be needed. Overdosage: If you think you have taken too much of this medicine contact a poison control center or emergency room at once. NOTE: This medicine is only for you. Do not share this medicine with others. What if I miss a dose? If you miss a dose, skip it. Take your next dose at the normal time. Do not take extra or 2 doses at the same time to make up for the missed dose. What may interact with this medicine? What may interact with this medicine? aminophylline carbamazepine cyclosporine digoxin levothyroxine other medicines for diabetes phenytoin tacrolimus theophylline warfarin Many medications may cause changes in blood sugar, these include: alcohol containing beverages antiviral medicines for HIV or AIDS aspirin and aspirin-like drugs certain medicines for blood pressure, heart disease, irregular heart beat chromium diuretics female hormones, such as estrogens or progestins, birth control pills fenofibrate gemfibrozil isoniazid lanreotide female hormones or anabolic steroids MAOIs like Carbex, Eldepryl, Marplan, Nardil, and Parnate medicines for weight loss medicines for allergies, asthma, cold, or cough medicines for depression, anxiety, or psychotic disturbances niacin nicotine NSAIDs, medicines for pain and inflammation, like ibuprofen or naproxen octreotide pasireotide pentamidine phenytoin probenecid quinolone antibiotics such as ciprofloxacin, levofloxacin, ofloxacin some herbal dietary supplements steroid medicines such as prednisone or cortisone sulfamethoxazole; trimethoprim thyroid hormones Some medications can hide the warning symptoms of low blood sugar (hypoglycemia). You may need to monitor your  blood sugar more closely if you are taking one of these medications. These include: beta-blockers, often used for high blood  pressure or heart problems (examples include atenolol, metoprolol, propranolol) clonidine guanethidine reserpine This list may not describe all possible interactions. Give your health care provider a list of all the medicines, herbs, non-prescription drugs, or dietary supplements you use. Also tell them if you smoke, drink alcohol, or use illegal drugs. Some items may interact with your medicine. What should I watch for while using this medicine? Visit your health care provider for regular checks on your progress. Check with your health care provider if you have severe diarrhea, nausea, and vomiting, or if you sweat a lot. The loss of too much body fluid may make it dangerous for you to take this medicine. A test called the HbA1C (A1C) will be monitored. This is a simple blood test. It measures your blood sugar control over the last 2 to 3 months. You will receive this test every 3 to 6 months. Learn how to check your blood sugar. Learn the symptoms of low and high blood sugar and how to manage them. Always carry a quick-source of sugar with you in case you have symptoms of low blood sugar. Examples include hard sugar candy or glucose tablets. Make sure others know that you can choke if you eat or drink when you develop serious symptoms of low blood sugar, such as seizures or unconsciousness. Get medical help at once. Tell your health care provider if you have high blood sugar. You might need to change the dose of your medicine. If you are sick or exercising more than usual, you might need to change the dose of your medicine. Do not skip meals. Ask your health care provider if you should avoid alcohol. Many nonprescription cough and cold products contain sugar or alcohol. These can affect blood sugar. Wear a medical ID bracelet or chain. Carry a card that describes your condition. List the medicines and doses you take on the card. Do not become pregnant while taking this medicine. Women should inform  their health care provider if they wish to become pregnant or think they might be pregnant. There is a potential for serious side effects to an unborn child. Talk to your health care provider for more information. Do not breast-feed an infant while taking this medicine. What side effects may I notice from receiving this medicine? Side effects that you should report to your doctor or health care provider as soon as possible: allergic reactions (skin rash, itching or hives; swelling of the face, lips, or tongue) changes in vision diarrhea that continues or is severe infection (fever, chills, cough, sore throat, pain or trouble passing urine) kidney injury (trouble passing urine or change in the amount of urine) low blood sugar (feeling anxious; confusion; dizziness; increased hunger; unusually weak or tired; increased sweating; shakiness; cold, clammy skin; irritable; headache; blurred vision; fast heartbeat; loss of consciousness) lump or swelling on the neck painful or difficulty swallowing severe nausea severe or unusual stomach pain trouble breathing vomiting Side effects that usually do not require medical attention (report these to your doctor or health care provider if they continue or are bothersome): constipation diarrhea nausea upset stomach This list may not describe all possible side effects. Call your doctor for medical advice about side effects. You may report side effects to FDA at 1-800-FDA-1088. Where should I keep my medicine? Keep out of the reach of children and pets. Store at room temperature between  20 and 25 degrees C (68 and 77 degrees F). Keep this medicine in the original container. Protect from moisture. Keep the container tightly closed. Get rid of any unused medicine after the expiration date. To get rid of medicines that are no longer needed or have expired: Take the medicine to a medicine take-back program. Check with your pharmacy or law enforcement to find a  location. If you cannot return the medicine, check the label or package insert to see if the medicine should be thrown out in the garbage or flushed down the toilet. If you are not sure, ask your health care provider. If it is safe to put it in the trash, take the medicine out of the container. Mix the medicine with cat litter, dirt, coffee grounds, or other unwanted substance. Seal the mixture in a bag or container. Put it in the trash. NOTE: This sheet is a summary. It may not cover all possible information. If you have questions about this medicine, talk to your doctor, pharmacist, or health care provider.  2021 Elsevier/Gold Standard (2020-03-11 15:08:33)

## 2020-11-27 ENCOUNTER — Other Ambulatory Visit: Payer: Self-pay

## 2020-11-27 DIAGNOSIS — E1169 Type 2 diabetes mellitus with other specified complication: Secondary | ICD-10-CM

## 2020-11-27 DIAGNOSIS — I10 Essential (primary) hypertension: Secondary | ICD-10-CM

## 2020-11-27 DIAGNOSIS — E559 Vitamin D deficiency, unspecified: Secondary | ICD-10-CM

## 2020-11-27 DIAGNOSIS — E785 Hyperlipidemia, unspecified: Secondary | ICD-10-CM

## 2020-11-27 DIAGNOSIS — E119 Type 2 diabetes mellitus without complications: Secondary | ICD-10-CM

## 2020-11-27 DIAGNOSIS — N1831 Chronic kidney disease, stage 3a: Secondary | ICD-10-CM

## 2020-12-17 ENCOUNTER — Other Ambulatory Visit: Payer: Self-pay

## 2020-12-17 MED FILL — Semaglutide Tab 7 MG: ORAL | 90 days supply | Qty: 90 | Fill #0 | Status: AC

## 2020-12-18 ENCOUNTER — Encounter: Payer: 59 | Admitting: Family

## 2020-12-27 ENCOUNTER — Other Ambulatory Visit: Payer: Self-pay

## 2020-12-31 ENCOUNTER — Ambulatory Visit: Payer: 59 | Admitting: Adult Health

## 2021-01-07 ENCOUNTER — Other Ambulatory Visit: Payer: Self-pay

## 2021-01-07 ENCOUNTER — Ambulatory Visit: Payer: 59

## 2021-01-07 DIAGNOSIS — G4733 Obstructive sleep apnea (adult) (pediatric): Secondary | ICD-10-CM | POA: Diagnosis not present

## 2021-01-07 DIAGNOSIS — R0683 Snoring: Secondary | ICD-10-CM

## 2021-01-08 ENCOUNTER — Other Ambulatory Visit: Payer: Self-pay | Admitting: Family

## 2021-01-08 DIAGNOSIS — Z1231 Encounter for screening mammogram for malignant neoplasm of breast: Secondary | ICD-10-CM

## 2021-01-08 DIAGNOSIS — G4733 Obstructive sleep apnea (adult) (pediatric): Secondary | ICD-10-CM | POA: Diagnosis not present

## 2021-01-16 ENCOUNTER — Telehealth: Payer: Self-pay | Admitting: Adult Health

## 2021-01-16 NOTE — Telephone Encounter (Signed)
Home sleep study done on January 07, 2021 shows moderate obstructive sleep apnea with AHI at 17.6/hour and SPO2 low at 83%. Please set up office visit or video visit to discuss results and treatment options

## 2021-01-20 NOTE — Telephone Encounter (Signed)
I called the patient to give her the results of HST per provider recommendations.

## 2021-01-21 NOTE — Telephone Encounter (Signed)
I spoke with the pt and notified of results/recs per TP  She verbalized understanding  I have scheduled her for mychart visit with TP for Thurs am  Nothing further needed

## 2021-01-23 ENCOUNTER — Encounter: Payer: Self-pay | Admitting: Adult Health

## 2021-01-23 ENCOUNTER — Telehealth: Payer: 59 | Admitting: Adult Health

## 2021-01-23 ENCOUNTER — Other Ambulatory Visit: Payer: Self-pay

## 2021-01-23 DIAGNOSIS — G4733 Obstructive sleep apnea (adult) (pediatric): Secondary | ICD-10-CM

## 2021-01-23 DIAGNOSIS — E669 Obesity, unspecified: Secondary | ICD-10-CM

## 2021-01-23 NOTE — Progress Notes (Signed)
Virtual Visit via Video Note  I connected with Katrina Roth on 01/23/21 at  9:30 AM EDT by a video enabled telemedicine application and verified that I am speaking with the correct person using two identifiers.  Location: Patient: Home  Provider: Office    I discussed the limitations of evaluation and management by telemedicine and the availability of in person appointments. The patient expressed understanding and agreed to proceed.  History of Present Illness: 50 yo female seen for sleep consult November 19, 2020 for snoring and restless sleep. 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  Patient was seen for sleep consult November 19, 2020 for snoring restless sleep.  She was set up for home sleep study that was completed on January 07, 2021 that showed moderate obstructive sleep apnea with AHI at 17.6/hour and SPO2 low at 83%.  We went over her test results in detail.  Patient education was provided on sleep apnea and potential treatment options including weight loss, oral appliance and CPAP.  After discussion patient would like to proceed with CPAP.  We discussed several mask options including a DreamWear nasal and DreamWear full face.   Past Medical History:  Diagnosis Date   Diabetes mellitus without complication ([place])    Hyperlipidemia    Hypertension    Obesity    Current Outpatient Medications on File Prior to Visit  Medication Sig Dispense Refill   amLODipine (NORVASC) 10 MG tablet Take 1 tablet (10 mg total) by mouth daily. 90 tablet 1   amLODipine (NORVASC) 10 MG tablet Take 1 tablet by mouth daily.     GNP GARLIC EXTRACT PO Take by mouth.     Multiple Vitamin (MULTIVITAMIN) tablet Take 1 tablet by mouth daily.     NON FORMULARY Chlorella green algae supplement     NON FORMULARY Green Vibrance probiotic  supplement     olmesartan-hydrochlorothiazide (BENICAR HCT) 40-12.5 MG tablet Take 1 tablet by mouth daily. 90 tablet 1   Omega 3 1200 MG CAPS Take by mouth.     Red Yeast Rice Extract (RED YEAST RICE PO) Take by mouth.     Semaglutide (RYBELSUS) 7 MG TABS Take 7 mg by mouth daily. 90 tablet 1   Semaglutide 7 MG TABS TAKE 1 TABLET BY MOUTH ONCE DAILY 30 tablet 3   No current facility-administered medications on file prior to visit.      Observations/Objective: Appears well, very pleasant   Home sleep study done on January 07, 2021 shows moderate obstructive sleep apnea with AHI at 17.6/hour and SPO2 low at 83%.   Assessment and Plan: Moderate obstructive sleep apnea-begin CPAP therapy-we will begin CPAP AutoSet 5-15 with mask of choice.  Enroll in [place].  Obesity-patient is encouraged on healthy weight loss.  Plan  Patient Instructions  Begin CPAP at bedtime Goal was to wear CPAP all night long for at least 6 or more hours. Healthy weight loss Do not drive if sleepy Follow-up in the office in 3 months and as needed    Follow Up Instructions:    I discussed the assessment and treatment plan with the patient. The patient was provided an opportunity to ask questions and all were answered. The patient agreed with the plan and demonstrated an understanding of the instructions.   The patient was advised to call back or seek an in-person evaluation if the  symptoms worsen or if the condition fails to improve as anticipated.  I provided 22 minutes of non-face-to-face time during this encounter.   Rexene Edison, NP

## 2021-01-23 NOTE — Patient Instructions (Addendum)
Begin CPAP at bedtime Goal was to wear CPAP all night long for at least 6 or more hours. Healthy weight loss Do not drive if sleepy Follow-up in the office in 3 months and as needed

## 2021-01-23 NOTE — Progress Notes (Signed)
Reviewed and agree with assessment/plan.   Chesley Mires, MD [facility] Pulmonary/Critical Care 01/23/2021, 10:46 AM Pager:  [phone]

## 2021-01-23 NOTE — Addendum Note (Signed)
Addended by: Dessie Coma on: 01/23/2021 10:32 AM   Modules accepted: Orders

## 2021-02-24 DIAGNOSIS — N760 Acute vaginitis: Secondary | ICD-10-CM | POA: Diagnosis not present

## 2021-02-24 DIAGNOSIS — R8290 Unspecified abnormal findings in urine: Secondary | ICD-10-CM | POA: Diagnosis not present

## 2021-03-03 ENCOUNTER — Other Ambulatory Visit: Payer: Self-pay

## 2021-03-03 ENCOUNTER — Other Ambulatory Visit (INDEPENDENT_AMBULATORY_CARE_PROVIDER_SITE_OTHER): Payer: 59

## 2021-03-03 ENCOUNTER — Encounter: Payer: Self-pay | Admitting: Family

## 2021-03-03 ENCOUNTER — Telehealth: Payer: Self-pay

## 2021-03-03 DIAGNOSIS — R8290 Unspecified abnormal findings in urine: Secondary | ICD-10-CM

## 2021-03-03 NOTE — Telephone Encounter (Signed)
I called to triage patient on pain. She is feeling mostly pressure in her vaginal area. Pressure is not just there when urinating but constant. She does have some pain, some itching & burning that has improved. Urine does have an odd or different odor. She saw GYN last Monday after symptoms started 2 weeks ago. They did UA & culture, but it took them a week to call her. She said there was no growth or sign of UTI. She was worried bc she feels that something just isn't right. She does not have any back pain & just worries that she only has the one kidney.   I have order repeat culture & UA that patient will leave today before she sees Dr. Olivia Mackie 11/9. She was advised that if any changing or worsening symptoms to please go & be evaluated by ED or UC ASAP.

## 2021-03-03 NOTE — Telephone Encounter (Signed)
Call triage pt  She will need an appt for in person eval  Sch with me or colleague  Depending on symptoms, I will advise UC or ED if no available appt    You routed conversation to Burnard Hawthorne, FNP 2 hours ago (10:55 AM)   Suzy Bouchard  P [facility] (supporting Burnard Hawthorne, FNP) 2 hours ago (10:48 AM)   Good morning, I was wondering if you could provide some assistance. I feel as if I have an infection. I am experiencing some discomfort. I went to my GYN (not sure of what I was dealing with) who performed an internal exam (negative for yeast), UA(no bacteria) and a urine culture (no growth). I am concerned since I am still in pain and am experiencing a different odor. I am wondering if this may be something that you can assist me with or if you would suggest that I get in to see a Urologist right away. My solitary kidney is also a concern for me. Thank you

## 2021-03-04 LAB — URINALYSIS, ROUTINE W REFLEX MICROSCOPIC
Bilirubin Urine: NEGATIVE
Hgb urine dipstick: NEGATIVE
Ketones, ur: NEGATIVE
Leukocytes,Ua: NEGATIVE
Nitrite: NEGATIVE
RBC / HPF: NONE SEEN (ref 0–?)
Specific Gravity, Urine: <=1.005 — AB (ref 1.000–1.030)
Total Protein, Urine: NEGATIVE
Urine Glucose: NEGATIVE
Urobilinogen, UA: 0.2 (ref 0.0–1.0)
WBC, UA: NONE SEEN (ref 0–?)
pH: 6 (ref 5.0–8.0)

## 2021-03-04 LAB — URINE CULTURE
MICRO NUMBER:: 12602768
Result:: NO GROWTH
SPECIMEN QUALITY:: ADEQUATE

## 2021-03-05 ENCOUNTER — Other Ambulatory Visit (HOSPITAL_COMMUNITY)
Admission: RE | Admit: 2021-03-05 | Discharge: 2021-03-05 | Disposition: A | Discharge status: Home / Self Care | Payer: 59 | Source: Ambulatory Visit | Attending: Internal Medicine | Admitting: Internal Medicine

## 2021-03-05 ENCOUNTER — Ambulatory Visit: Payer: 59 | Admitting: Internal Medicine

## 2021-03-05 ENCOUNTER — Other Ambulatory Visit: Payer: Self-pay

## 2021-03-05 ENCOUNTER — Encounter: Payer: Self-pay | Admitting: Internal Medicine

## 2021-03-05 ENCOUNTER — Ambulatory Visit: Payer: 59 | Admitting: Dermatology

## 2021-03-05 VITALS — BP 124/82 | HR 114 | Temp 98.2°F | Ht 66.0 in | Wt 228.2 lb

## 2021-03-05 DIAGNOSIS — R102 Pelvic and perineal pain: Secondary | ICD-10-CM | POA: Diagnosis not present

## 2021-03-05 DIAGNOSIS — N760 Acute vaginitis: Secondary | ICD-10-CM | POA: Insufficient documentation

## 2021-03-05 DIAGNOSIS — N200 Calculus of kidney: Secondary | ICD-10-CM | POA: Diagnosis not present

## 2021-03-05 DIAGNOSIS — R1030 Lower abdominal pain, unspecified: Secondary | ICD-10-CM | POA: Insufficient documentation

## 2021-03-05 MED ORDER — FLUCONAZOLE 150 MG PO TABS
150.0000 mg | ORAL_TABLET | Freq: Once | ORAL | 0 refills | Status: AC
Start: 2021-03-05 — End: 2021-03-06
  Filled 2021-03-05: qty 1, 1d supply, fill #0

## 2021-03-05 NOTE — Patient Instructions (Signed)

## 2021-03-05 NOTE — Progress Notes (Signed)
Chief Complaint  Patient presents with   Vaginal Pain   F/u  1. Vaginal pain/pressure x 3 weeks uses Summers eve and boric acid at times to cleanse  urine culture negative 03/03/21 s/p right nephrectomy appt  S/p partial hysterectomy fibroid removal 1 ovary left and cervix left  Has vaginal irritation and odor of urine   Nephrologist appt 04/2021 [facility] Dr. Lanora Manis  Urology Dr. Copy/[facility]  Ob/gyn [facility] seen 02/24/21 urine culture neg no wet prep done  Review of Systems  Constitutional:  Negative for weight loss.  HENT:  Negative for hearing loss.   Eyes:  Negative for blurred vision.  Respiratory:  Negative for shortness of breath.   Cardiovascular:  Negative for chest pain.  Gastrointestinal:  Positive for abdominal pain. Negative for blood in stool.  Genitourinary:  Negative for dysuria.  Musculoskeletal:  Negative for falls and joint pain.  Skin:  Negative for rash.  Neurological:  Negative for headaches.  Psychiatric/Behavioral:  Negative for depression.   Past Medical History:  Diagnosis Date   Diabetes mellitus without complication ([place])    Fibroids    Hyperlipidemia    Hypertension    Obesity    Past Surgical History:  Procedure Laterality Date   ABDOMINAL HYSTERECTOMY  2004   Partial still has ? 1 ovary and 1 removed and cervix stomp   COLONOSCOPY WITH PROPOFOL N/A 10/31/2020   Procedure: COLONOSCOPY WITH PROPOFOL;  Surgeon: Lin Landsman, MD;  Location: [facility];  Service: Gastroenterology;  Laterality: N/A;   NEPHRECTOMY  1982   Right - genetic defect per MD.    Family History  Problem Relation Age of Onset   Heart disease Mother    ADD / ADHD Father    Drug abuse Father    Hypertension Sister    Social History   Socioeconomic History   Marital status: Single    Spouse name: Not on file   Number of children: Not on file   Years of education: Not on file   Highest education level: Bachelor's degree (e.g., BA, AB, BS)  Occupational  History   Occupation: Patient Accounting  Tobacco Use   Smoking status: Never   Smokeless tobacco: Never  Vaping Use   Vaping Use: Never used  Substance and Sexual Activity   Alcohol use: Not Currently   Drug use: No   Sexual activity: Not Currently  Other Topics Concern   Not on file  Social History Narrative   From [place] [employer] [place], live alone, no pets       BS degree       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   amLODipine (NORVASC) 10 MG tablet Take 1 tablet by mouth daily.   fluconazole (DIFLUCAN) 150 MG tablet Take 1 tablet (150 mg total) by mouth once for 1 dose.   GNP GARLIC EXTRACT PO Take by mouth.   Multiple Vitamin (MULTIVITAMIN) tablet Take 1 tablet by mouth daily.   NON FORMULARY Chlorella green algae supplement   NON FORMULARY Green Vibrance probiotic supplement  olmesartan-hydrochlorothiazide (BENICAR HCT) 40-12.5 MG tablet Take 1 tablet by mouth daily.   Omega 3 1200 MG CAPS Take by mouth.   Red Yeast Rice Extract (RED YEAST RICE PO) Take by mouth.   Semaglutide 7 MG TABS TAKE 1 TABLET BY MOUTH ONCE DAILY   Allergies  Allergen Reactions   Codeine Other (See Comments)   Lactose Intolerance (Gi) Other (See Comments)    intolerance   Other Other (See Comments)    EGGPLANT   Recent Results (from the past 2160 hour(s))  Urinalysis, Routine w reflex microscopic     Status: Abnormal   Collection Time: 03/03/21  2:55 PM  Result Value Ref Range   Color, Urine YELLOW Yellow;Lt. Yellow;Straw;Dark Yellow;Amber;Green;Red;Brown   APPearance CLEAR Clear;Turbid;Slightly Cloudy;Cloudy    Specific Gravity, Urine <=1.005 (A) 1.000 - 1.030   pH 6.0 5.0 - 8.0   Total Protein, Urine NEGATIVE Negative   Urine Glucose NEGATIVE Negative   Ketones, ur NEGATIVE Negative   Bilirubin Urine NEGATIVE Negative   Hgb urine dipstick NEGATIVE Negative   Urobilinogen, UA 0.2 0.0 - 1.0   Leukocytes,Ua NEGATIVE Negative   Nitrite NEGATIVE Negative   WBC, UA none seen 0-2/hpf   RBC / HPF none seen 0-2/hpf   Mucus, UA Presence of (A) None   Squamous Epithelial / LPF Rare(0-4/hpf) Rare(0-4/hpf)  Urine Culture     Status: None   Collection Time: 03/03/21  2:55 PM   Specimen: Urine  Result Value Ref Range   MICRO NUMBER: [id number]    SPECIMEN QUALITY: Adequate    Sample Source NOT GIVEN    STATUS: FINAL    Result: No Growth    Objective  Body mass index is 36.83 kg/m. Wt Readings from Last 3 Encounters:  03/05/21 228 lb 3.2 oz (103.5 kg)  11/25/20 225 lb 3.2 oz (102.2 kg)  11/19/20 227 lb 9.6 oz (103.2 kg)   Temp Readings from Last 3 Encounters:  03/05/21 98.2 F (36.8 C) (Oral)  11/25/20 98.4 F (36.9 C) (Oral)  11/19/20 98 F (36.7 C) (Oral)   BP Readings from Last 3 Encounters:  03/05/21 124/82  11/25/20 110/80  11/19/20 130/82   Pulse Readings from Last 3 Encounters:  03/05/21 (!) 114  11/25/20 (!) 105  11/19/20 99    Physical Exam Vitals and nursing note reviewed.  Constitutional:      Appearance: Normal appearance. She is well-developed and well-groomed.  HENT:     Head: Normocephalic and atraumatic.  Eyes:     Conjunctiva/sclera: Conjunctivae normal.     Pupils: Pupils are equal, round, and reactive to light.  Cardiovascular:     Rate and Rhythm: Normal rate and regular rhythm.     Heart sounds: Normal heart sounds. No murmur heard. Pulmonary:     Effort: Pulmonary effort is normal.     Breath sounds: Normal breath sounds.  Abdominal:     General: Abdomen is flat. Bowel sounds are normal.     Tenderness: There is no abdominal tenderness.   Genitourinary:    Exam position: Supine.     Pubic Area: No rash.      Labia:        Right: No rash.        Left: No rash.      Vagina: Normal.     Cervix: Discharge present.     Uterus: Absent.   Musculoskeletal:        General: No tenderness.  Skin:    General: Skin is  warm and dry.  Neurological:     General: No focal deficit present.     Mental Status: She is alert and oriented to person, place, and time. Mental status is at baseline.     Cranial Nerves: Cranial nerves 2-12 are intact.     Gait: Gait is intact.  Psychiatric:        Attention and Perception: Attention and perception normal.        Mood and Affect: Mood and affect normal.        Speech: Speech normal.        Behavior: Behavior normal. Behavior is cooperative.        Thought Content: Thought content normal.        Cognition and Memory: Cognition and memory normal.        Judgment: Judgment normal.    Assessment  Plan  Acute vaginitis - Plan: fluconazole (DIFLUCAN) 150 MG tablet, Cervicovaginal ancillary only( [place])  Kidney stone on left side - Plan: CT ABDOMEN PELVIS WO CONTRAST  Lower abdominal pain - Plan: CT ABDOMEN PELVIS WO CONTRAST, Cervicovaginal ancillary only( [place])  Pelvic pain - Plan: CT ABDOMEN PELVIS WO CONTRAST, Cervicovaginal ancillary only( [place])   Only has left kidney  Consider f/u urology for kidney stone  F/u renal 04/2021  Provider: Dr. Olivia Mackie [facility]

## 2021-03-07 LAB — CERVICOVAGINAL ANCILLARY ONLY
Bacterial Vaginitis (gardnerella): NEGATIVE
Candida Glabrata: NEGATIVE
Candida Vaginitis: NEGATIVE
Comment: NEGATIVE
Comment: NEGATIVE
Comment: NEGATIVE
Comment: NEGATIVE
Trichomonas: NEGATIVE

## 2021-03-10 NOTE — Telephone Encounter (Signed)
Seen by dr Aundra Dubin 03/05/21

## 2021-03-12 ENCOUNTER — Ambulatory Visit (HOSPITAL_COMMUNITY)
Admission: RE | Admit: 2021-03-12 | Discharge: 2021-03-12 | Disposition: A | Discharge status: Home / Self Care | Payer: 59 | Source: Ambulatory Visit | Attending: Internal Medicine | Admitting: Internal Medicine

## 2021-03-12 DIAGNOSIS — N200 Calculus of kidney: Secondary | ICD-10-CM | POA: Diagnosis not present

## 2021-03-12 DIAGNOSIS — K5730 Diverticulosis of large intestine without perforation or abscess without bleeding: Secondary | ICD-10-CM | POA: Diagnosis not present

## 2021-03-12 DIAGNOSIS — R1030 Lower abdominal pain, unspecified: Secondary | ICD-10-CM | POA: Insufficient documentation

## 2021-03-12 DIAGNOSIS — R102 Pelvic and perineal pain: Secondary | ICD-10-CM | POA: Diagnosis not present

## 2021-03-12 DIAGNOSIS — K7689 Other specified diseases of liver: Secondary | ICD-10-CM | POA: Diagnosis not present

## 2021-03-12 DIAGNOSIS — K429 Umbilical hernia without obstruction or gangrene: Secondary | ICD-10-CM | POA: Diagnosis not present

## 2021-03-13 ENCOUNTER — Telehealth: Payer: Self-pay

## 2021-03-13 ENCOUNTER — Encounter: Payer: Self-pay | Admitting: Internal Medicine

## 2021-03-13 NOTE — Telephone Encounter (Signed)
Patient returned office phone call. 

## 2021-03-13 NOTE — Telephone Encounter (Signed)
See result note.  

## 2021-03-13 NOTE — Telephone Encounter (Signed)
LMTCB for CT results.

## 2021-03-26 DIAGNOSIS — N200 Calculus of kidney: Secondary | ICD-10-CM | POA: Diagnosis not present

## 2021-03-26 DIAGNOSIS — R1084 Generalized abdominal pain: Secondary | ICD-10-CM | POA: Diagnosis not present

## 2021-03-27 ENCOUNTER — Ambulatory Visit
Admission: RE | Admit: 2021-03-27 | Discharge: 2021-03-27 | Disposition: A | Discharge status: Home / Self Care | Payer: 59 | Source: Ambulatory Visit | Attending: Family | Admitting: Family

## 2021-03-27 DIAGNOSIS — Z1231 Encounter for screening mammogram for malignant neoplasm of breast: Secondary | ICD-10-CM

## 2021-03-31 ENCOUNTER — Other Ambulatory Visit: Payer: Self-pay | Admitting: Family

## 2021-03-31 ENCOUNTER — Other Ambulatory Visit: Payer: Self-pay

## 2021-03-31 DIAGNOSIS — I10 Essential (primary) hypertension: Secondary | ICD-10-CM

## 2021-03-31 MED ORDER — AMLODIPINE BESYLATE 10 MG PO TABS
10.0000 mg | ORAL_TABLET | Freq: Every day | ORAL | 1 refills | Status: DC
  Filled 2021-03-31: qty 90, 90d supply, fill #0

## 2021-03-31 MED ORDER — OLMESARTAN MEDOXOMIL-HCTZ 40-12.5 MG PO TABS
1.0000 | ORAL_TABLET | Freq: Every day | ORAL | 1 refills | Status: DC
  Filled 2021-03-31: qty 90, 90d supply, fill #0
  Filled 2021-07-07: qty 90, 90d supply, fill #1

## 2021-04-01 DIAGNOSIS — H5203 Hypermetropia, bilateral: Secondary | ICD-10-CM | POA: Diagnosis not present

## 2021-04-01 LAB — HM DIABETES EYE EXAM

## 2021-04-22 DIAGNOSIS — G4733 Obstructive sleep apnea (adult) (pediatric): Secondary | ICD-10-CM | POA: Diagnosis not present

## 2021-05-14 ENCOUNTER — Encounter: Payer: Self-pay | Admitting: Family

## 2021-05-23 ENCOUNTER — Other Ambulatory Visit: Payer: Self-pay

## 2021-05-23 ENCOUNTER — Other Ambulatory Visit (INDEPENDENT_AMBULATORY_CARE_PROVIDER_SITE_OTHER): Payer: 59

## 2021-05-23 DIAGNOSIS — E1169 Type 2 diabetes mellitus with other specified complication: Secondary | ICD-10-CM | POA: Diagnosis not present

## 2021-05-23 DIAGNOSIS — G4733 Obstructive sleep apnea (adult) (pediatric): Secondary | ICD-10-CM | POA: Diagnosis not present

## 2021-05-23 DIAGNOSIS — E785 Hyperlipidemia, unspecified: Secondary | ICD-10-CM

## 2021-05-23 DIAGNOSIS — N1831 Chronic kidney disease, stage 3a: Secondary | ICD-10-CM | POA: Diagnosis not present

## 2021-05-23 DIAGNOSIS — E559 Vitamin D deficiency, unspecified: Secondary | ICD-10-CM

## 2021-05-23 DIAGNOSIS — I10 Essential (primary) hypertension: Secondary | ICD-10-CM | POA: Diagnosis not present

## 2021-05-23 LAB — CBC WITH DIFFERENTIAL/PLATELET
Basophils Absolute: 0 10*3/uL (ref 0.0–0.1)
Basophils Relative: 0.7 % (ref 0.0–3.0)
Eosinophils Absolute: 0.1 10*3/uL (ref 0.0–0.7)
Eosinophils Relative: 2.9 % (ref 0.0–5.0)
HCT: 40.9 % (ref 36.0–46.0)
Hemoglobin: 12.9 g/dL (ref 12.0–15.0)
Lymphocytes Relative: 27.6 % (ref 12.0–46.0)
Lymphs Abs: 1.3 10*3/uL (ref 0.7–4.0)
MCHC: 31.6 g/dL (ref 30.0–36.0)
MCV: 81.3 fl (ref 78.0–100.0)
Monocytes Absolute: 0.3 10*3/uL (ref 0.1–1.0)
Monocytes Relative: 6.4 % (ref 3.0–12.0)
Neutro Abs: 2.8 10*3/uL (ref 1.4–7.7)
Neutrophils Relative %: 62.4 % (ref 43.0–77.0)
Platelets: 254 10*3/uL (ref 150.0–400.0)
RBC: 5.04 Mil/uL (ref 3.87–5.11)
RDW: 14.8 % (ref 11.5–15.5)
WBC: 4.5 10*3/uL (ref 4.0–10.5)

## 2021-05-23 LAB — COMPREHENSIVE METABOLIC PANEL
ALT: 18 U/L (ref 0–35)
AST: 22 U/L (ref 0–37)
Albumin: 4.5 g/dL (ref 3.5–5.2)
Alkaline Phosphatase: 70 U/L (ref 39–117)
BUN: 14 mg/dL (ref 6–23)
CO2: 32 mEq/L (ref 19–32)
Calcium: 9.8 mg/dL (ref 8.4–10.5)
Chloride: 102 mEq/L (ref 96–112)
Creatinine, Ser: 1.17 mg/dL (ref 0.40–1.20)
GFR: 53.97 mL/min — ABNORMAL LOW (ref >60.00)
Glucose, Bld: 85 mg/dL (ref 70–99)
Potassium: 3.9 mEq/L (ref 3.5–5.1)
Sodium: 143 mEq/L (ref 135–145)
Total Bilirubin: 0.4 mg/dL (ref 0.2–1.2)
Total Protein: 7.6 g/dL (ref 6.0–8.3)

## 2021-05-23 LAB — LIPID PANEL
Cholesterol: 171 mg/dL (ref 0–200)
HDL: 57.5 mg/dL (ref >39.00)
LDL Cholesterol: 96 mg/dL (ref 0–99)
NonHDL: 113.04
Total CHOL/HDL Ratio: 3
Triglycerides: 86 mg/dL (ref 0.0–149.0)
VLDL: 17.2 mg/dL (ref 0.0–40.0)

## 2021-05-23 LAB — TSH: TSH: 1.36 u[IU]/mL (ref 0.35–5.50)

## 2021-05-23 LAB — VITAMIN D 25 HYDROXY (VIT D DEFICIENCY, FRACTURES): VITD: 52.77 ng/mL (ref 30.00–100.00)

## 2021-05-23 LAB — HEMOGLOBIN A1C: Hgb A1c MFr Bld: 6 % (ref 4.6–6.5)

## 2021-05-28 ENCOUNTER — Ambulatory Visit: Payer: 59 | Admitting: Family

## 2021-05-28 ENCOUNTER — Other Ambulatory Visit: Payer: Self-pay

## 2021-05-28 ENCOUNTER — Encounter: Payer: Self-pay | Admitting: Family

## 2021-05-28 VITALS — BP 120/78 | Temp 98.5°F | Ht 66.0 in | Wt 225.0 lb

## 2021-05-28 DIAGNOSIS — I10 Essential (primary) hypertension: Secondary | ICD-10-CM | POA: Diagnosis not present

## 2021-05-28 DIAGNOSIS — N1831 Chronic kidney disease, stage 3a: Secondary | ICD-10-CM | POA: Diagnosis not present

## 2021-05-28 NOTE — Assessment & Plan Note (Signed)
Excellent control.  Continue olmesartan-hydrochlorothiazide 40-12.5 mg

## 2021-05-28 NOTE — Progress Notes (Signed)
Subjective:    Patient ID: Katrina Roth, female    DOB: 10/29/1970, 51 y.o.   MRN: [id number]  CC: [name] is a 51 y.o. female who presents today for follow up.   HPI: Feels well today.  No new complaints.  She is working diligently on eating healthier.  She is excited about weight loss.  She has had some constipation on Rybelsus however this seems to be well managed.  She would like to continue medication at current dose   Hypertension-compliant with olmesartan-hydrochlorothiazide 40-12.5 mg . No cp.   history of prediabetes  History of right nephrectomy.  She has seen nephrology in the past.  She doesn't take NSAIDs  HISTORY:  Past Medical History:  Diagnosis Date   Diabetes mellitus without complication ([place])    Fibroids    Hyperlipidemia    Hypertension    Obesity    Past Surgical History:  Procedure Laterality Date   ABDOMINAL HYSTERECTOMY  2004   Partial still has ? 1 ovary and 1 removed and cervix stomp   COLONOSCOPY WITH PROPOFOL N/A 10/31/2020   Procedure: COLONOSCOPY WITH PROPOFOL;  Surgeon: Lin Landsman, MD;  Location: [facility];  Service: Gastroenterology;  Laterality: N/A;   NEPHRECTOMY  1982   Right - genetic defect per MD   Family History  Problem Relation Age of Onset   Heart disease Mother    ADD / ADHD Father    Drug abuse Father    Hypertension Sister     Allergies: Codeine, Lactose intolerance (gi), and Other Current Outpatient Medications on File Prior to Visit  Medication Sig Dispense Refill   amLODipine (NORVASC) 10 MG tablet Take 1 tablet (10 mg total) by mouth daily. 90 tablet 1   GNP GARLIC EXTRACT PO Take by mouth.     Multiple Vitamin (MULTIVITAMIN) tablet Take 1 tablet by mouth daily.     NON FORMULARY Chlorella green algae supplement     NON FORMULARY Green Vibrance probiotic supplement     olmesartan-hydrochlorothiazide (BENICAR HCT) 40-12.5 MG tablet Take 1 tablet by mouth daily. 90 tablet 1   Omega 3 1200 MG  CAPS Take by mouth.     Red Yeast Rice Extract (RED YEAST RICE PO) Take by mouth.     Semaglutide (RYBELSUS) 7 MG TABS Take 1 tablet by mouth daily.     No current facility-administered medications on file prior to visit.    Social History   Tobacco Use   Smoking status: Never   Smokeless tobacco: Never  Vaping Use   Vaping Use: Never used  Substance Use Topics   Alcohol use: Not Currently   Drug use: No    Review of Systems  Constitutional:  Negative for chills and fever.  Respiratory:  Negative for cough.   Cardiovascular:  Negative for chest pain and palpitations.  Gastrointestinal:  Negative for nausea and vomiting.     Objective:    BP 120/78 (BP Location: Left Arm, Patient Position: Sitting, Cuff Size: Large)    Temp 98.5 F (36.9 C) (Oral)    Ht 5\' 6"  (1.676 m)    Wt 225 lb (102.1 kg)    BMI 36.32 kg/m  BP Readings from Last 3 Encounters:  05/28/21 120/78  03/05/21 124/82  11/25/20 110/80   Wt Readings from Last 3 Encounters:  05/28/21 225 lb (102.1 kg)  03/05/21 228 lb 3.2 oz (103.5 kg)  11/25/20 225 lb 3.2 oz (102.2 kg)    Physical Exam  Vitals reviewed.  Constitutional:      Appearance: She is well-developed.  Eyes:     Conjunctiva/sclera: Conjunctivae normal.  Cardiovascular:     Rate and Rhythm: Normal rate and regular rhythm.     Pulses: Normal pulses.     Heart sounds: Normal heart sounds.  Pulmonary:     Effort: Pulmonary effort is normal.     Breath sounds: Normal breath sounds. No wheezing, rhonchi or rales.  Skin:    General: Skin is warm and dry.  Neurological:     Mental Status: She is alert.  Psychiatric:        Speech: Speech normal.        Behavior: Behavior normal.        Thought Content: Thought content normal.       Assessment & Plan:   Problem List Items Addressed This Visit       Cardiovascular and Mediastinum   Essential hypertension    Excellent control.  Continue olmesartan-hydrochlorothiazide 40-12.5 mg         Genitourinary   Stage 3a chronic kidney disease ([place]) - Primary    We plan to repeat renal indices in the next couple weeks.  Patient will hydrate well before appointment. Discussed briefly if SGLT2 would be consideration for further renal protection. Would appreciate nephrology advice.   She will continue to abstain from NSAIDs.  She will also call to make follow-up with nephrology, Dr Lanora Manis ( last seen 03/2020).  Long discussion as it relates to history of right nephrectomy and we agreed to maintain established care with nephrology for this reason.      Relevant Orders   Basic metabolic panel     I am having Maricela Bo. Kronk maintain her multivitamin, Omega 3, GNP GARLIC EXTRACT PO, Red Yeast Rice Extract (RED YEAST RICE PO), NON FORMULARY, NON FORMULARY, amLODipine, olmesartan-hydrochlorothiazide, and Rybelsus.   No orders of the defined types were placed in this encounter.   Return precautions given.   Risks, benefits, and alternatives of the medications and treatment plan prescribed today were discussed, and patient expressed understanding.   Education regarding symptom management and diagnosis given to patient on AVS.  Continue to follow with Burnard Hawthorne, FNP for routine health maintenance.   Suzy Bouchard and I agreed with plan.   Mable Paris, FNP

## 2021-05-28 NOTE — Assessment & Plan Note (Addendum)
We plan to repeat renal indices in the next couple weeks.  Patient will hydrate well before appointment. Discussed briefly if SGLT2 would be consideration for further renal protection. Would appreciate nephrology advice.   She will continue to abstain from NSAIDs.  She will also call to make follow-up with nephrology, Dr Lanora Manis ( last seen 03/2020).  Long discussion as it relates to history of right nephrectomy and we agreed to maintain established care with nephrology for this reason.

## 2021-05-28 NOTE — Patient Instructions (Addendum)
We should consider whether drug class SLGT2 ( eg Jardiance, Wilder Glade) would be beneficial to further protecting renal function.    Consider Debrox   Nice to see you!

## 2021-06-20 ENCOUNTER — Encounter: Payer: Self-pay | Admitting: Adult Health

## 2021-06-20 ENCOUNTER — Other Ambulatory Visit: Payer: Self-pay

## 2021-06-20 ENCOUNTER — Ambulatory Visit: Payer: 59 | Admitting: Adult Health

## 2021-06-20 DIAGNOSIS — E669 Obesity, unspecified: Secondary | ICD-10-CM | POA: Diagnosis not present

## 2021-06-20 DIAGNOSIS — G4733 Obstructive sleep apnea (adult) (pediatric): Secondary | ICD-10-CM | POA: Diagnosis not present

## 2021-06-20 NOTE — Patient Instructions (Addendum)
Wear  CPAP at bedtime all night long Goal was to wear CPAP all night long for at least 6 or more hours. Healthy weight loss Do not drive if sleepy May try dream wear nasal mask.  Follow-up in the office in with Dr Mortimer Fries or Dolores Ewing in 6 months and as needed

## 2021-06-20 NOTE — Assessment & Plan Note (Signed)
Compensated on present regimen -encourage on usage all night .  Healthy sleep regimen  Melatonin As needed   Change to dream wear nasal   Plan  Patient Instructions  Wear  CPAP at bedtime all night long Goal was to wear CPAP all night long for at least 6 or more hours. Healthy weight loss Do not drive if sleepy May try dream wear nasal mask.  Follow-up in the office in with Dr Mortimer Fries or Adeena Bernabe in 6 months and as needed

## 2021-06-20 NOTE — Assessment & Plan Note (Signed)
Healthy weight loss 

## 2021-06-20 NOTE — Progress Notes (Signed)
@Patient  ID: Katrina Roth, female    DOB: 17-Aug-1970, 51 y.o.   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  Patient was seen July 2022 for a sleep consult.  She was found to have moderate obstructive sleep apnea on home sleep study.  She was recommended begin CPAP therapy.  Patient says she is trying to get used to her CPAP.  She tries to wear it each night.  Typically gets in about 4 -5 hours on her CPAP.  Patient says it does help some. Marland Kitchen Has changed to nasal mask feels some better but tubing is uncomfortable .  CPAP download shows 90% compliance daily average usage at 4 hours.  Patient is on AutoSet 5 to 15 cm H2O.  Daily average pressure at 9.3 cm H2O.  AHI is 1.0.  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  Goes to bed 9-10pm , goes to sleep . Puts on CPAP as soon as she gets into bed. Wakes up most night at 2 am . Light sleeper, wakes up easily.  Trying melatonin which seems to help some with sleep.      Allergies  Allergen Reactions   Codeine Other (See Comments)   Lactose Intolerance (Gi) Other (See Comments)    intolerance   Other Other (See Comments)    EGGPLANT    Immunization History  Administered Date(s) Administered   Influenza-Unspecified 01/26/2019, 01/26/2020, 02/03/2021   PFIZER(Purple Top)SARS-COV-2 Vaccination 07/28/2019, 08/18/2019, 05/07/2020   Pneumococcal  Polysaccharide-23 03/18/2020   Tdap 11/25/2020    Past Medical History:  Diagnosis Date   Diabetes mellitus without complication ([place])    Fibroids    Hyperlipidemia    Hypertension    Obesity     Tobacco History: Social History   Tobacco Use  Smoking Status Never  Smokeless Tobacco Never   Counseling given: Not Answered   Outpatient Medications Prior to Visit  Medication Sig Dispense Refill   amLODipine (NORVASC) 10 MG tablet Take 1 tablet (10 mg total) by mouth daily. 90 tablet 1   GNP GARLIC EXTRACT PO Take by mouth.     Multiple Vitamin (MULTIVITAMIN) tablet Take 1 tablet by mouth daily.     NON FORMULARY Chlorella green algae supplement     NON FORMULARY Green Vibrance probiotic supplement     olmesartan-hydrochlorothiazide (BENICAR HCT) 40-12.5 MG tablet Take 1 tablet by mouth daily. 90 tablet 1   Omega 3 1200 MG CAPS Take by mouth.     Red Yeast Rice Extract (RED YEAST RICE PO) Take by mouth.     Semaglutide (RYBELSUS) 7 MG TABS Take 1 tablet by mouth daily.     No facility-administered medications prior to visit.     Review of Systems:   Constitutional:   No  weight loss, night sweats,  Fevers, chills,  +fatigue, or  lassitude.  HEENT:   No headaches,  Difficulty swallowing,  Tooth/dental  problems, or  Sore throat,                No sneezing, itching, ear ache, nasal congestion, post nasal drip,   CV:  No chest pain,  Orthopnea, PND, swelling in lower extremities, anasarca, dizziness, palpitations, syncope.   GI  No heartburn, indigestion, abdominal pain, nausea, vomiting, diarrhea, change in bowel habits, loss of appetite, bloody stools.   Resp: No shortness of breath with exertion or at rest.  No excess mucus, no productive cough,  No non-productive cough,  No coughing up of blood.  No change in color of mucus.  No wheezing.  No chest wall deformity  Skin: no rash or lesions.  GU: no dysuria, change in color of urine, no urgency or frequency.  No flank  pain, no hematuria   MS:  No joint pain or swelling.  No decreased range of motion.  No back pain.    Physical Exam  BP 128/76 (BP Location: Left Arm, Cuff Size: Normal)    Pulse 100    Temp 97.9 F (36.6 C) (Temporal)    Ht 5\' 6"  (1.676 m)    Wt 225 lb 3.2 oz (102.2 kg)    SpO2 99%    BMI 36.35 kg/m   GEN: A/Ox3; pleasant , NAD, well nourished    HEENT:  [place]/AT,  NOSE-clear, THROAT-clear, no lesions, no postnasal drip or exudate noted. Class 2 MP airway   NECK:  Supple w/ fair ROM; no JVD; normal carotid impulses w/o bruits; no thyromegaly or nodules palpated; no lymphadenopathy.    RESP  Clear  P & A; w/o, wheezes/ rales/ or rhonchi. no accessory muscle use, no dullness to percussion  CARD:  RRR, no m/r/g, no peripheral edema, pulses intact, no cyanosis or clubbing.  GI:   Soft & nt; nml bowel sounds; no organomegaly or masses detected.   Musco: Warm bil, no deformities or joint swelling noted.   Neuro: alert, no focal deficits noted.    Skin: Warm, no lesions or rashes    Lab Results:    BNP No results found for: BNP  ProBNP No results found for: PROBNP  Imaging: No results found.    No flowsheet data found.  No results found for: NITRICOXIDE      Assessment & Plan:   OSA (obstructive sleep apnea) Compensated on present regimen -encourage on usage all night .  Healthy sleep regimen  Melatonin As needed   Change to dream wear nasal   Plan  Patient Instructions  Wear  CPAP at bedtime all night long Goal was to wear CPAP all night long for at least 6 or more hours. Healthy weight loss Do not drive if sleepy May try dream wear nasal mask.  Follow-up in the office in with Dr Mortimer Fries or Sorren Vallier in 6 months and as needed        Obesity (BMI 35.0-39.9 without comorbidity) Healthy weight loss.      Rexene Edison, NP 06/20/2021

## 2021-06-23 DIAGNOSIS — G4733 Obstructive sleep apnea (adult) (pediatric): Secondary | ICD-10-CM | POA: Diagnosis not present

## 2021-06-27 ENCOUNTER — Other Ambulatory Visit: Payer: Self-pay

## 2021-06-27 ENCOUNTER — Other Ambulatory Visit (INDEPENDENT_AMBULATORY_CARE_PROVIDER_SITE_OTHER): Payer: 59

## 2021-06-27 DIAGNOSIS — N1831 Chronic kidney disease, stage 3a: Secondary | ICD-10-CM | POA: Diagnosis not present

## 2021-06-27 LAB — BASIC METABOLIC PANEL
BUN: 13 mg/dL (ref 6–23)
CO2: 32 mEq/L (ref 19–32)
Calcium: 9.5 mg/dL (ref 8.4–10.5)
Chloride: 100 mEq/L (ref 96–112)
Creatinine, Ser: 1.05 mg/dL (ref 0.40–1.20)
GFR: 61.41 mL/min (ref >60.00)
Glucose, Bld: 91 mg/dL (ref 70–99)
Potassium: 3.9 mEq/L (ref 3.5–5.1)
Sodium: 140 mEq/L (ref 135–145)

## 2021-07-07 ENCOUNTER — Other Ambulatory Visit: Payer: Self-pay | Admitting: Family

## 2021-07-07 ENCOUNTER — Other Ambulatory Visit: Payer: Self-pay

## 2021-07-07 DIAGNOSIS — I10 Essential (primary) hypertension: Secondary | ICD-10-CM

## 2021-07-07 MED ORDER — AMLODIPINE BESYLATE 10 MG PO TABS
10.0000 mg | ORAL_TABLET | Freq: Every day | ORAL | 1 refills | Status: DC
  Filled 2021-07-07: qty 90, 90d supply, fill #0
  Filled 2021-10-27: qty 90, 90d supply, fill #1

## 2021-07-16 DIAGNOSIS — G4733 Obstructive sleep apnea (adult) (pediatric): Secondary | ICD-10-CM | POA: Diagnosis not present

## 2021-07-21 DIAGNOSIS — G4733 Obstructive sleep apnea (adult) (pediatric): Secondary | ICD-10-CM | POA: Diagnosis not present

## 2021-10-15 DIAGNOSIS — G4733 Obstructive sleep apnea (adult) (pediatric): Secondary | ICD-10-CM | POA: Diagnosis not present

## 2021-10-27 ENCOUNTER — Other Ambulatory Visit: Payer: Self-pay

## 2021-10-27 ENCOUNTER — Other Ambulatory Visit: Payer: Self-pay | Admitting: Family

## 2021-10-27 DIAGNOSIS — I10 Essential (primary) hypertension: Secondary | ICD-10-CM

## 2021-10-27 MED ORDER — OLMESARTAN MEDOXOMIL-HCTZ 40-12.5 MG PO TABS
1.0000 | ORAL_TABLET | Freq: Every day | ORAL | 1 refills | Status: DC
  Filled 2021-10-27: qty 90, 90d supply, fill #0
  Filled 2022-01-21: qty 90, 90d supply, fill #1

## 2021-12-19 IMAGING — MG MM DIGITAL SCREENING BILAT W/ TOMO AND CAD
8 series · 8 of 24 positions shown · non-contrast
Comparison: Previous exam(s).

CLINICAL DATA: Screening.

EXAM:
DIGITAL SCREENING BILATERAL MAMMOGRAM WITH TOMOSYNTHESIS AND CAD
TECHNIQUE: Bilateral screening digital craniocaudal and mediolateral oblique
mammograms were obtained. Bilateral screening digital breast
tomosynthesis was performed. The images were evaluated with
computer-aided detection.

[L MLO synth-2D]
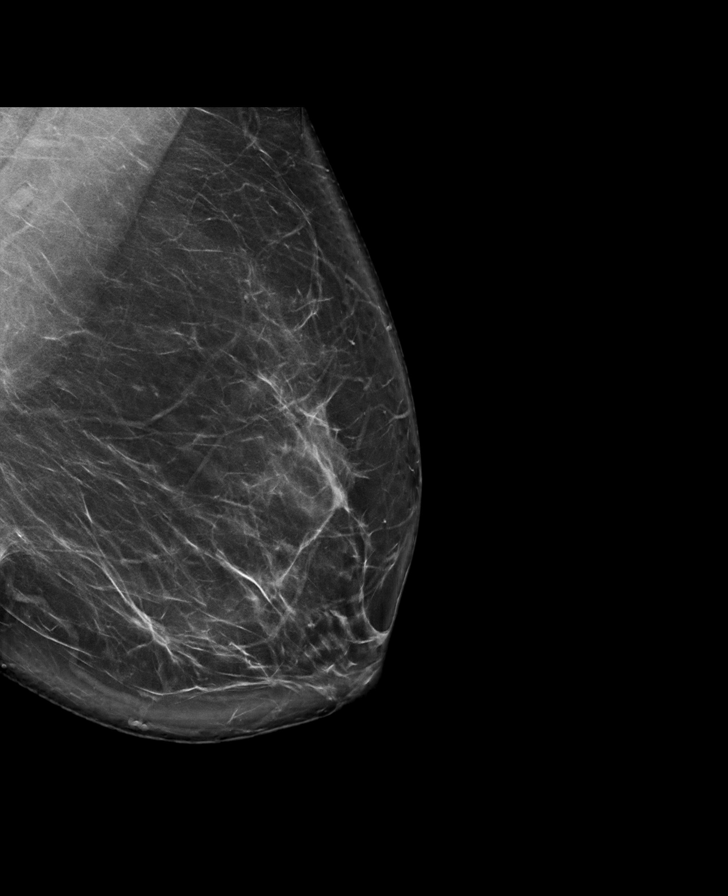

[R CC synth-2D]
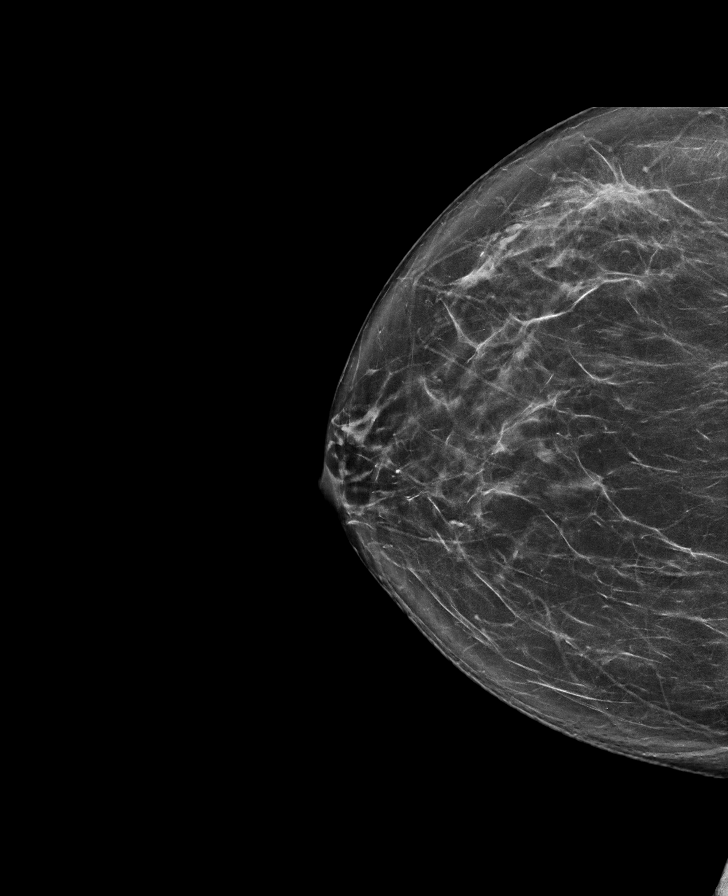

[R MLO synth-2D]
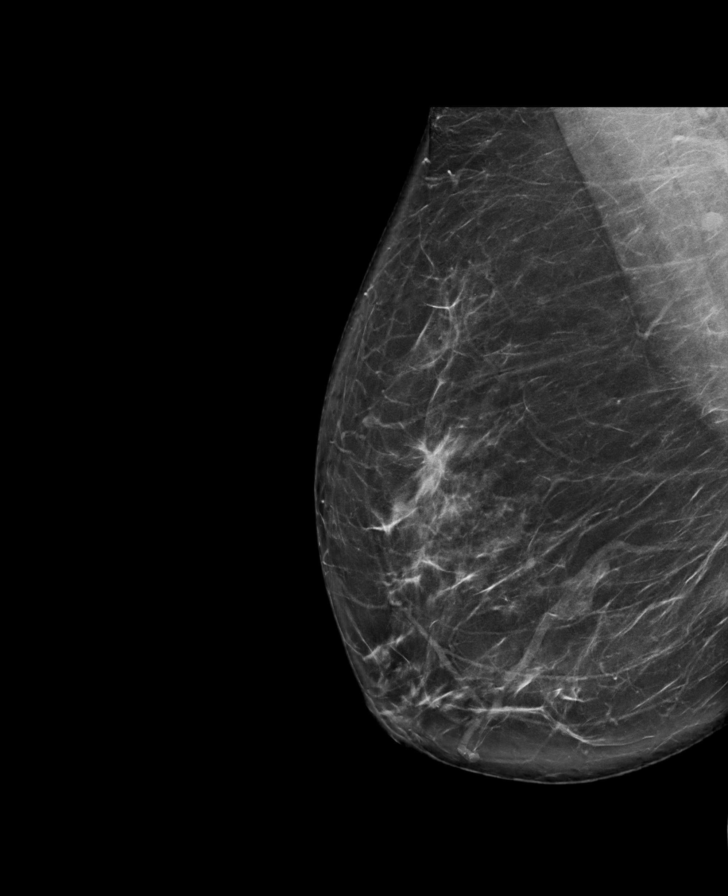

[L CC synth-2D]
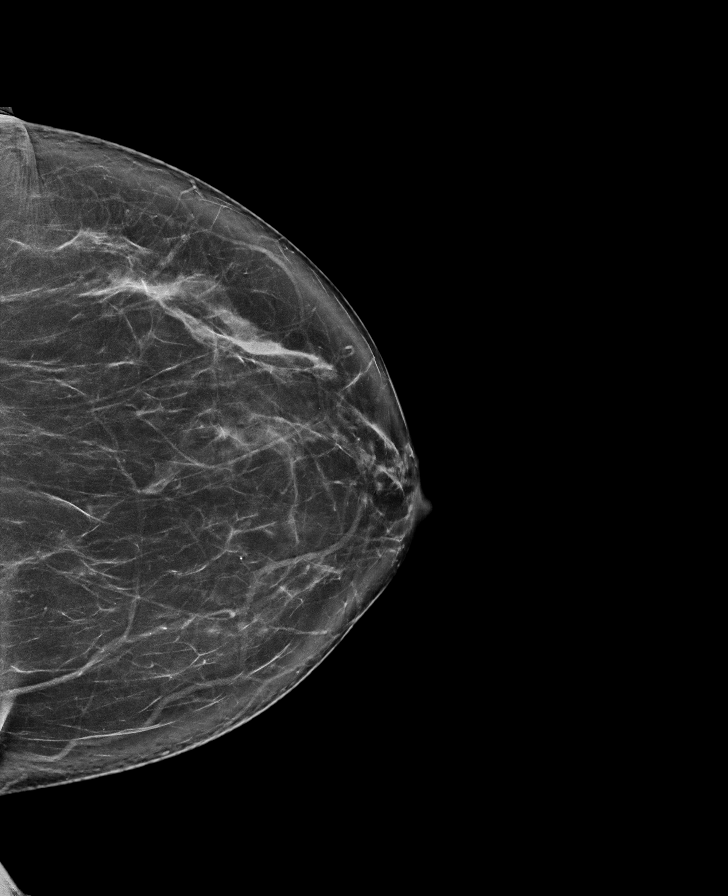

[R CC tomo · tomo slice 43/84.0]
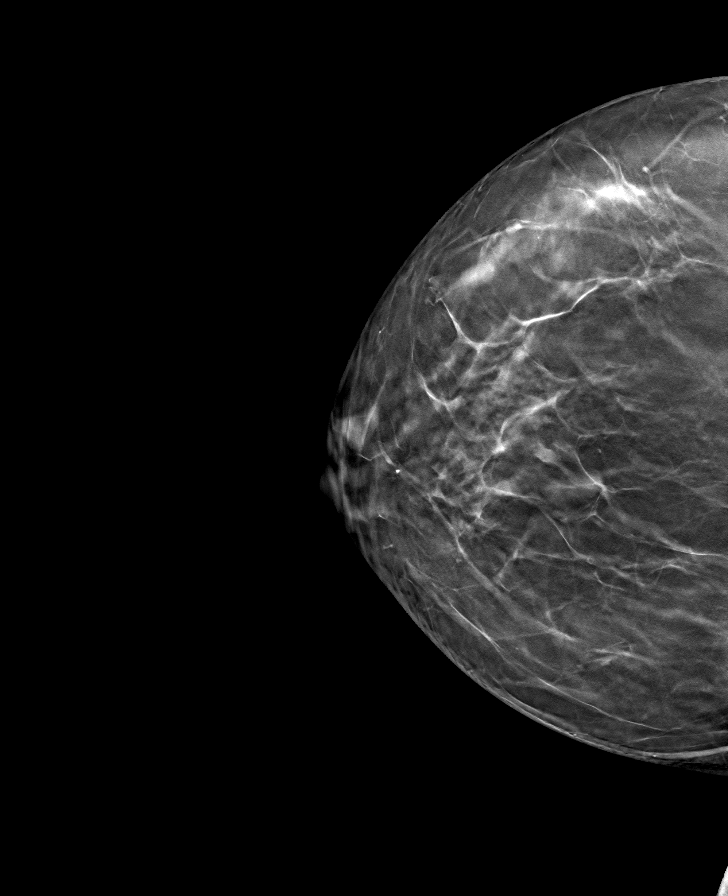

[R MLO tomo · tomo slice 47/94.0]
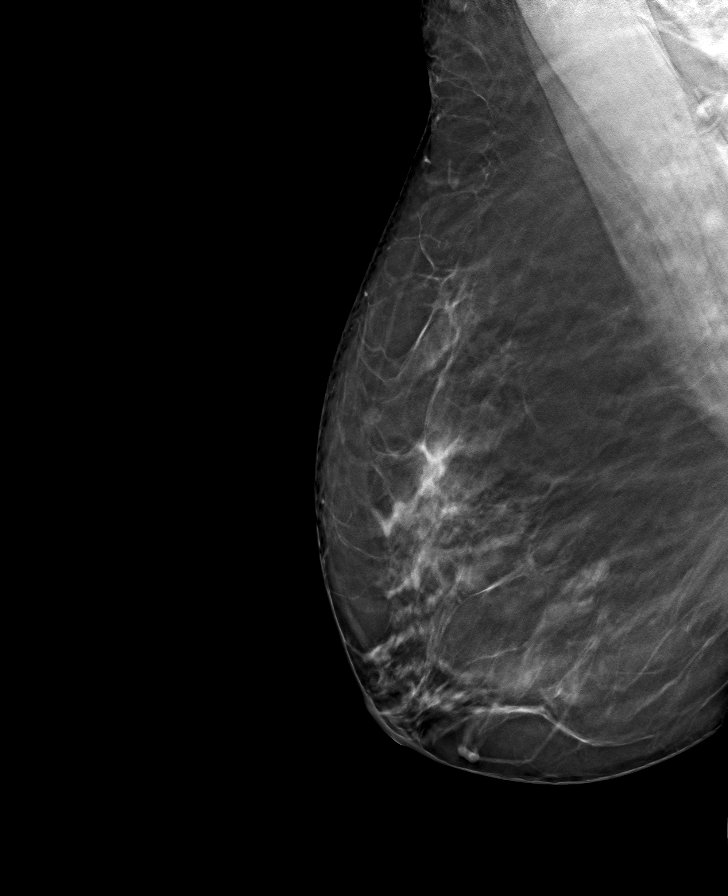

[L MLO tomo · tomo slice 49/97.0]
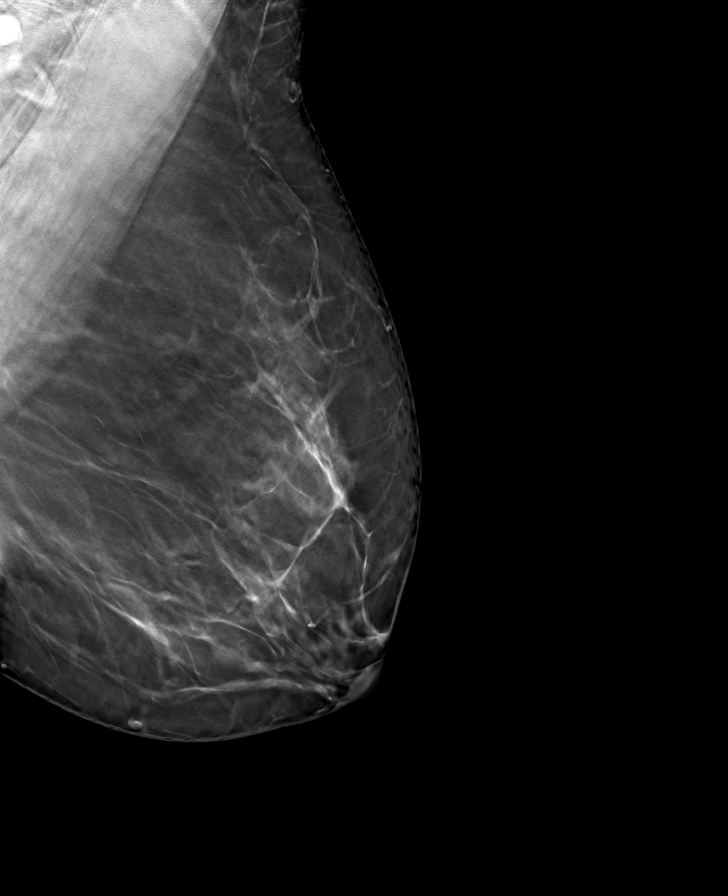

[L CC tomo · tomo slice 43/86.0]
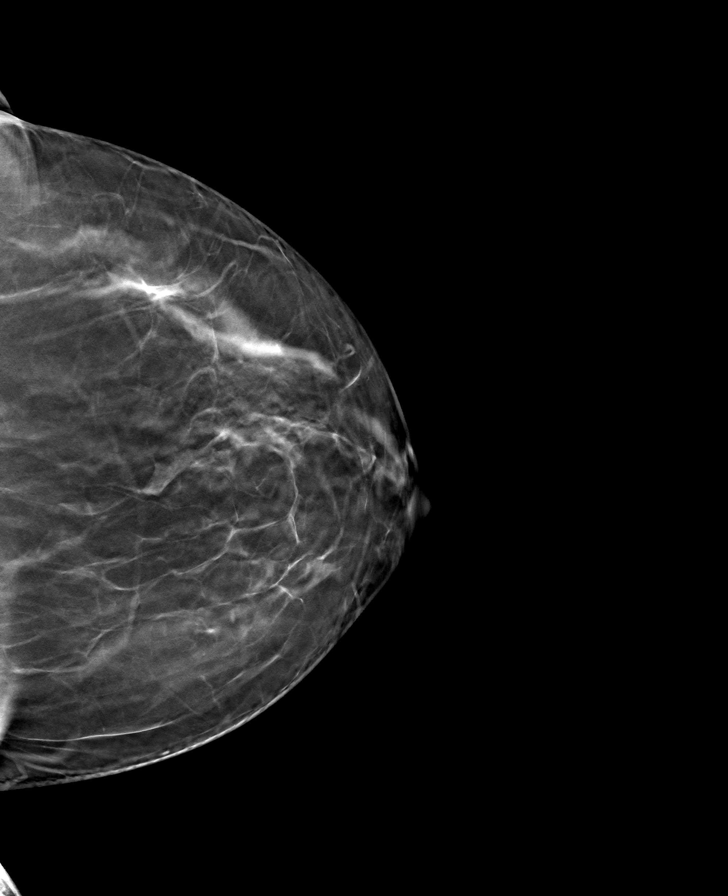

[8 of 24 positions shown; findings below may reference images not displayed]

ACR Breast Density Category b: There are scattered areas of
fibroglandular density.
FINDINGS: There are no findings suspicious for malignancy.
IMPRESSION: No mammographic evidence of malignancy. A result letter of this
screening mammogram will be mailed directly to the patient.

RECOMMENDATION:
Screening mammogram in one year. (Code:51-O-LD2)

BI-RADS CATEGORY  1: Negative.

## 2022-01-21 ENCOUNTER — Other Ambulatory Visit: Payer: Self-pay | Admitting: Family

## 2022-01-21 ENCOUNTER — Other Ambulatory Visit: Payer: Self-pay

## 2022-01-21 DIAGNOSIS — I10 Essential (primary) hypertension: Secondary | ICD-10-CM

## 2022-01-21 MED ORDER — AMLODIPINE BESYLATE 10 MG PO TABS
10.0000 mg | ORAL_TABLET | Freq: Every day | ORAL | 1 refills | Status: DC
  Filled 2022-01-21: qty 90, 90d supply, fill #0
  Filled 2022-05-11: qty 90, 90d supply, fill #1

## 2022-01-22 ENCOUNTER — Other Ambulatory Visit: Payer: Self-pay | Admitting: Family

## 2022-01-22 ENCOUNTER — Other Ambulatory Visit: Payer: Self-pay

## 2022-01-23 ENCOUNTER — Other Ambulatory Visit: Payer: Self-pay

## 2022-01-23 MED ORDER — RYBELSUS 7 MG PO TABS
1.0000 | ORAL_TABLET | Freq: Every day | ORAL | 3 refills | Status: DC
  Filled 2022-01-23: qty 30, 30d supply, fill #0

## 2022-01-26 ENCOUNTER — Encounter: Payer: Self-pay | Admitting: Family

## 2022-01-26 ENCOUNTER — Other Ambulatory Visit (HOSPITAL_COMMUNITY)
Admission: RE | Admit: 2022-01-26 | Discharge: 2022-01-26 | Disposition: A | Discharge status: Home / Self Care | Payer: 59 | Source: Ambulatory Visit | Attending: Family | Admitting: Family

## 2022-01-26 ENCOUNTER — Other Ambulatory Visit: Payer: Self-pay

## 2022-01-26 ENCOUNTER — Ambulatory Visit (INDEPENDENT_AMBULATORY_CARE_PROVIDER_SITE_OTHER): Payer: 59 | Admitting: Family

## 2022-01-26 VITALS — BP 128/78 | HR 102 | Temp 98.9°F | Ht 66.0 in | Wt 227.2 lb

## 2022-01-26 DIAGNOSIS — R7303 Prediabetes: Secondary | ICD-10-CM | POA: Diagnosis not present

## 2022-01-26 DIAGNOSIS — Z0000 Encounter for general adult medical examination without abnormal findings: Secondary | ICD-10-CM

## 2022-01-26 LAB — POCT GLYCOSYLATED HEMOGLOBIN (HGB A1C): Hemoglobin A1C: 5.8 % — AB (ref 4.0–5.6)

## 2022-01-26 MED ORDER — RYBELSUS 7 MG PO TABS
1.0000 | ORAL_TABLET | Freq: Every day | ORAL | 3 refills | Status: DC
  Filled 2022-01-26 – 2022-01-30 (×4): qty 90, 90d supply, fill #0

## 2022-01-26 NOTE — Assessment & Plan Note (Signed)
Chronic, stable.  Continue Rybelsus 7 mg.  I have refilled today.

## 2022-01-26 NOTE — Assessment & Plan Note (Signed)
Clinical breast exam and Pap smear performed today.  Patient will schedule mammogram.  Encouraged exercise.

## 2022-01-26 NOTE — Progress Notes (Signed)
Subjective:    Patient ID: Katrina Roth, female    DOB: 1971-01-25, 51 y.o.   MRN: [id number]  CC: Katrina Roth is a 51 y.o. female who presents today for physical exam.    HPI: Feels well today.  No new complaints   Weight is stable.  She would like to refill Rybelsus 7 mg.  She feels overall she is tolerating this medication well with occasional, dry mouth.  no constipation Colorectal Cancer Screening: UTD , 10/31/20, repeat in 5 years Breast Cancer Screening: Mammogram UTD Cervical Cancer Screening: due; no records of prior pap. She reports 2020 normal pap smear.          [place] -UTD Labs: Screening labs done 04/2021. F/u with [facility] Exercise: Gets regular exercise with stationary bike and dance breaks.    Alcohol use:  none Smoking/tobacco use: Nonsmoker.     HISTORY:  Past Medical History:  Diagnosis Date   Diabetes mellitus without complication ([place])    Fibroids    Hyperlipidemia    Hypertension    Obesity     Past Surgical History:  Procedure Laterality Date   ABDOMINAL HYSTERECTOMY  2004   Partial still has ? 1 ovary and 1 removed and cervix stomp   COLONOSCOPY WITH PROPOFOL N/A 10/31/2020   Procedure: COLONOSCOPY WITH PROPOFOL;  Surgeon: Lin Landsman, MD;  Location: [facility];  Service: Gastroenterology;  Laterality: N/A;   NEPHRECTOMY  1982   Right - genetic defect per MD   Family History  Problem Relation Age of Onset   Heart disease Mother    ADD / ADHD Father    Drug abuse Father    Hypertension Sister       ALLERGIES: Codeine, Lactose intolerance (gi), and Other  Current Outpatient Medications on File Prior to Visit  Medication Sig Dispense Refill   amLODipine (NORVASC) 10 MG tablet Take 1 tablet (10 mg total) by mouth daily. 90 tablet 1   GNP GARLIC EXTRACT PO Take by mouth.     Multiple Vitamin (MULTIVITAMIN) tablet Take 1 tablet by mouth daily.     NON FORMULARY Chlorella green algae supplement     NON FORMULARY Green  Vibrance probiotic supplement     olmesartan-hydrochlorothiazide (BENICAR HCT) 40-12.5 MG tablet Take 1 tablet by mouth daily. 90 tablet 1   Omega 3 1200 MG CAPS Take by mouth.     Red Yeast Rice Extract (RED YEAST RICE PO) Take by mouth.     No current facility-administered medications on file prior to visit.    Social History   Tobacco Use   Smoking status: Never   Smokeless tobacco: Never  Vaping Use   Vaping Use: Never used  Substance Use Topics   Alcohol use: Not Currently   Drug use: No    Review of Systems  Constitutional:  Negative for chills, fever and unexpected weight change.  HENT:  Negative for congestion.   Respiratory:  Negative for cough.   Cardiovascular:  Negative for chest pain, palpitations and leg swelling.  Gastrointestinal:  Negative for nausea and vomiting.  Musculoskeletal:  Negative for arthralgias and myalgias.  Skin:  Negative for rash.  Neurological:  Negative for headaches.  Hematological:  Negative for adenopathy.  Psychiatric/Behavioral:  Negative for confusion.       Objective:    BP 128/78 (BP Location: Left Arm, Patient Position: Sitting, Cuff Size: Normal)   Pulse (!) 102   Temp 98.9 F (37.2 C) (Oral)   Ht  $'5\' 6"'z$  (1.676 m)   Wt 227 lb 3.2 oz (103.1 kg)   SpO2 98%   BMI 36.67 kg/m   BP Readings from Last 3 Encounters:  01/26/22 128/78  06/20/21 128/76  05/28/21 120/78   Wt Readings from Last 3 Encounters:  01/26/22 227 lb 3.2 oz (103.1 kg)  06/20/21 225 lb 3.2 oz (102.2 kg)  05/28/21 225 lb (102.1 kg)    Physical Exam Vitals reviewed.  Constitutional:      Appearance: Normal appearance. She is well-developed.  Eyes:     Conjunctiva/sclera: Conjunctivae normal.  Neck:     Thyroid: No thyroid mass or thyromegaly.  Cardiovascular:     Rate and Rhythm: Normal rate and regular rhythm.     Pulses: Normal pulses.     Heart sounds: Normal heart sounds.  Pulmonary:     Effort: Pulmonary effort is normal.     Breath  sounds: Normal breath sounds. No wheezing, rhonchi or rales.  Chest:  Breasts:    Breasts are symmetrical.     Right: No inverted nipple, mass, nipple discharge, skin change or tenderness.     Left: No inverted nipple, mass, nipple discharge, skin change or tenderness.  Abdominal:     General: Bowel sounds are normal. There is no distension.     Palpations: Abdomen is soft. Abdomen is not rigid. There is no fluid wave or mass.     Tenderness: There is no abdominal tenderness. There is no guarding or rebound.  Genitourinary:    Cervix: No cervical motion tenderness, discharge or friability.     Uterus: Not enlarged, not fixed and not tender.      Adnexa:        Right: No mass, tenderness or fullness.         Left: No mass, tenderness or fullness.       Comments: Pap performed. No CMT. Unable to appreciated ovaries. Lymphadenopathy:     Head:     Right side of head: No submental, submandibular, tonsillar, preauricular, posterior auricular or occipital adenopathy.     Left side of head: No submental, submandibular, tonsillar, preauricular, posterior auricular or occipital adenopathy.     Cervical:     Right cervical: No superficial, deep or posterior cervical adenopathy.    Left cervical: No superficial, deep or posterior cervical adenopathy.     Upper Body:     Right upper body: No pectoral adenopathy.     Left upper body: No pectoral adenopathy.  Skin:    General: Skin is warm and dry.  Neurological:     Mental Status: She is alert.  Psychiatric:        Speech: Speech normal.        Behavior: Behavior normal.        Thought Content: Thought content normal.        Assessment & Plan:   Problem List Items Addressed This Visit       Other   Prediabetes    Chronic, stable.  Continue Rybelsus 7 mg.  I have refilled today.      Relevant Medications   Semaglutide (RYBELSUS) 7 MG TABS   Routine physical examination - Primary    Clinical breast exam and Pap smear performed  today.  Patient will schedule mammogram.  Encouraged exercise.       Relevant Orders   Cytology - PAP   MM 3D SCREEN BREAST BILATERAL     I am having Katrina Roth maintain her multivitamin,  Omega 3, GNP GARLIC EXTRACT PO, Red Yeast Rice Extract (RED YEAST RICE PO), NON FORMULARY, NON FORMULARY, olmesartan-hydrochlorothiazide, amLODipine, and Rybelsus.   Meds ordered this encounter  Medications   Semaglutide (RYBELSUS) 7 MG TABS    Sig: Take 1 tablet by mouth daily.    Dispense:  90 tablet    Refill:  3    Order Specific Question:   Supervising Provider    Answer:   Crecencio Mc [[id number]]    Return precautions given.   Risks, benefits, and alternatives of the medications and treatment plan prescribed today were discussed, and patient expressed understanding.   Education regarding symptom management and diagnosis given to patient on AVS.   Continue to follow with Katrina Hawthorne, FNP for routine health maintenance.   Katrina Roth and I agreed with plan.   Katrina Paris, FNP

## 2022-01-26 NOTE — Patient Instructions (Signed)
Please schedule your mammogram at [facility] as we discussed.  It is due in December Nice to see you!  Health Maintenance for Postmenopausal Women Menopause is a normal process in which your ability to get pregnant comes to an end. This process happens slowly over many months or years, usually between the ages of 19 and 67. Menopause is complete when you have missed your menstrual period for 12 months. It is important to talk with your health care provider about some of the most common conditions that affect women after menopause (postmenopausal women). These include heart disease, cancer, and bone loss (osteoporosis). Adopting a healthy lifestyle and getting preventive care can help to promote your health and wellness. The actions you take can also lower your chances of developing some of these common conditions. What are the signs and symptoms of menopause? During menopause, you may have the following symptoms: Hot flashes. These can be moderate or severe. Night sweats. Decrease in sex drive. Mood swings. Headaches. Tiredness (fatigue). Irritability. Memory problems. Problems falling asleep or staying asleep. Talk with your health care provider about treatment options for your symptoms. Do I need hormone replacement therapy? Hormone replacement therapy is effective in treating symptoms that are caused by menopause, such as hot flashes and night sweats. Hormone replacement carries certain risks, especially as you become older. If you are thinking about using estrogen or estrogen with progestin, discuss the benefits and risks with your health care provider. How can I reduce my risk for heart disease and stroke? The risk of heart disease, heart attack, and stroke increases as you age. One of the causes may be a change in the body's hormones during menopause. This can affect how your body uses dietary fats, triglycerides, and cholesterol. Heart attack and stroke are medical emergencies.  There are many things that you can do to help prevent heart disease and stroke. Watch your blood pressure High blood pressure causes heart disease and increases the risk of stroke. This is more likely to develop in people who have high blood pressure readings or are overweight. Have your blood pressure checked: Every 3-5 years if you are 52-63 years of age. Every year if you are 89 years old or older. Eat a healthy diet  Eat a diet that includes plenty of vegetables, fruits, low-fat dairy products, and lean protein. Do not eat a lot of foods that are high in solid fats, added sugars, or sodium. Get regular exercise Get regular exercise. This is one of the most important things you can do for your health. Most adults should: Try to exercise for at least 150 minutes each week. The exercise should increase your heart rate and make you sweat (moderate-intensity exercise). Try to do strengthening exercises at least twice each week. Do these in addition to the moderate-intensity exercise. Spend less time sitting. Even light physical activity can be beneficial. Other tips Work with your health care provider to achieve or maintain a healthy weight. Do not use any products that contain nicotine or tobacco. These products include cigarettes, chewing tobacco, and vaping devices, such as e-cigarettes. If you need help quitting, ask your health care provider. Know your numbers. Ask your health care provider to check your cholesterol and your blood sugar (glucose). Continue to have your blood tested as directed by your health care provider. Do I need screening for cancer? Depending on your health history and family history, you may need to have cancer screenings at different stages of your life. This may include  screening for: Breast cancer. Cervical cancer. Lung cancer. Colorectal cancer. What is my risk for osteoporosis? After menopause, you may be at increased risk for osteoporosis. Osteoporosis is a  condition in which bone destruction happens more quickly than new bone creation. To help prevent osteoporosis or the bone fractures that can happen because of osteoporosis, you may take the following actions: If you are 82-82 years old, get at least 1,000 mg of calcium and at least 600 international units (IU) of vitamin D per day. If you are older than age 65 but younger than age 95, get at least 1,200 mg of calcium and at least 600 international units (IU) of vitamin D per day. If you are older than age 89, get at least 1,200 mg of calcium and at least 800 international units (IU) of vitamin D per day. Smoking and drinking excessive alcohol increase the risk of osteoporosis. Eat foods that are rich in calcium and vitamin D, and do weight-bearing exercises several times each week as directed by your health care provider. How does menopause affect my mental health? Depression may occur at any age, but it is more common as you become older. Common symptoms of depression include: Feeling depressed. Changes in sleep patterns. Changes in appetite or eating patterns. Feeling an overall lack of motivation or enjoyment of activities that you previously enjoyed. Frequent crying spells. Talk with your health care provider if you think that you are experiencing any of these symptoms. General instructions See your health care provider for regular wellness exams and vaccines. This may include: Scheduling regular health, dental, and eye exams. Getting and maintaining your vaccines. These include: Influenza vaccine. Get this vaccine each year before the flu season begins. Pneumonia vaccine. Shingles vaccine. Tetanus, diphtheria, and pertussis (Tdap) booster vaccine. Your health care provider may also recommend other immunizations. Tell your health care provider if you have ever been abused or do not feel safe at home. Summary Menopause is a normal process in which your ability to get pregnant comes to an  end. This condition causes hot flashes, night sweats, decreased interest in sex, mood swings, headaches, or lack of sleep. Treatment for this condition may include hormone replacement therapy. Take actions to keep yourself healthy, including exercising regularly, eating a healthy diet, watching your weight, and checking your blood pressure and blood sugar levels. Get screened for cancer and depression. Make sure that you are up to date with all your vaccines. This information is not intended to replace advice given to you by your health care provider. Make sure you discuss any questions you have with your health care provider. Document Revised: 09/02/2020 Document Reviewed: 09/02/2020 Elsevier Patient Education  [place].

## 2022-01-27 ENCOUNTER — Other Ambulatory Visit: Payer: Self-pay

## 2022-01-28 ENCOUNTER — Other Ambulatory Visit: Payer: Self-pay

## 2022-01-28 LAB — CYTOLOGY - PAP
Adequacy: ABSENT
Comment: NEGATIVE
Diagnosis: NEGATIVE
High risk HPV: NEGATIVE

## 2022-01-29 ENCOUNTER — Other Ambulatory Visit: Payer: Self-pay

## 2022-01-29 ENCOUNTER — Other Ambulatory Visit (HOSPITAL_COMMUNITY): Payer: Self-pay

## 2022-01-30 ENCOUNTER — Other Ambulatory Visit: Payer: Self-pay

## 2022-02-06 ENCOUNTER — Other Ambulatory Visit: Payer: Self-pay

## 2022-02-06 MED ORDER — COMIRNATY 30 MCG/0.3ML IM SUSP
INTRAMUSCULAR | 0 refills | Status: DC
  Filled 2022-02-06: qty 0.3, 1d supply, fill #0

## 2022-02-12 DIAGNOSIS — Z905 Acquired absence of kidney: Secondary | ICD-10-CM | POA: Diagnosis not present

## 2022-02-12 DIAGNOSIS — R808 Other proteinuria: Secondary | ICD-10-CM | POA: Diagnosis not present

## 2022-02-12 DIAGNOSIS — N1831 Chronic kidney disease, stage 3a: Secondary | ICD-10-CM | POA: Diagnosis not present

## 2022-02-12 DIAGNOSIS — I10 Essential (primary) hypertension: Secondary | ICD-10-CM | POA: Diagnosis not present

## 2022-02-12 DIAGNOSIS — E119 Type 2 diabetes mellitus without complications: Secondary | ICD-10-CM | POA: Diagnosis not present

## 2022-02-12 DIAGNOSIS — N200 Calculus of kidney: Secondary | ICD-10-CM | POA: Diagnosis not present

## 2022-02-12 DIAGNOSIS — E785 Hyperlipidemia, unspecified: Secondary | ICD-10-CM | POA: Diagnosis not present

## 2022-02-12 DIAGNOSIS — Z9889 Other specified postprocedural states: Secondary | ICD-10-CM | POA: Diagnosis not present

## 2022-02-12 DIAGNOSIS — E663 Overweight: Secondary | ICD-10-CM | POA: Diagnosis not present

## 2022-02-16 ENCOUNTER — Encounter: Payer: Self-pay | Admitting: Family

## 2022-02-18 DIAGNOSIS — R808 Other proteinuria: Secondary | ICD-10-CM | POA: Diagnosis not present

## 2022-02-18 DIAGNOSIS — N1831 Chronic kidney disease, stage 3a: Secondary | ICD-10-CM | POA: Diagnosis not present

## 2022-02-18 DIAGNOSIS — Z9889 Other specified postprocedural states: Secondary | ICD-10-CM | POA: Diagnosis not present

## 2022-02-18 DIAGNOSIS — E119 Type 2 diabetes mellitus without complications: Secondary | ICD-10-CM | POA: Diagnosis not present

## 2022-02-18 DIAGNOSIS — E785 Hyperlipidemia, unspecified: Secondary | ICD-10-CM | POA: Diagnosis not present

## 2022-02-18 DIAGNOSIS — E663 Overweight: Secondary | ICD-10-CM | POA: Diagnosis not present

## 2022-02-18 DIAGNOSIS — Z905 Acquired absence of kidney: Secondary | ICD-10-CM | POA: Diagnosis not present

## 2022-02-18 DIAGNOSIS — N200 Calculus of kidney: Secondary | ICD-10-CM | POA: Diagnosis not present

## 2022-02-18 DIAGNOSIS — I10 Essential (primary) hypertension: Secondary | ICD-10-CM | POA: Diagnosis not present

## 2022-02-19 DIAGNOSIS — G4733 Obstructive sleep apnea (adult) (pediatric): Secondary | ICD-10-CM | POA: Diagnosis not present

## 2022-03-17 ENCOUNTER — Other Ambulatory Visit: Payer: Self-pay

## 2022-03-17 MED ORDER — SHINGRIX 50 MCG/0.5ML IM SUSR
INTRAMUSCULAR | 1 refills | Status: DC
  Filled 2022-03-17: qty 1, 1d supply, fill #0
  Filled 2022-06-12: qty 1, 1d supply, fill #1

## 2022-04-06 DIAGNOSIS — R1084 Generalized abdominal pain: Secondary | ICD-10-CM | POA: Diagnosis not present

## 2022-04-06 DIAGNOSIS — Z905 Acquired absence of kidney: Secondary | ICD-10-CM | POA: Diagnosis not present

## 2022-04-06 DIAGNOSIS — N200 Calculus of kidney: Secondary | ICD-10-CM | POA: Diagnosis not present

## 2022-04-09 DIAGNOSIS — H52223 Regular astigmatism, bilateral: Secondary | ICD-10-CM | POA: Diagnosis not present

## 2022-04-09 LAB — HM DIABETES EYE EXAM

## 2022-04-14 ENCOUNTER — Ambulatory Visit
Admission: RE | Admit: 2022-04-14 | Discharge: 2022-04-14 | Disposition: A | Discharge status: Home / Self Care | Payer: 59 | Source: Ambulatory Visit | Attending: Family | Admitting: Family

## 2022-04-14 DIAGNOSIS — Z0000 Encounter for general adult medical examination without abnormal findings: Secondary | ICD-10-CM

## 2022-04-14 DIAGNOSIS — Z1231 Encounter for screening mammogram for malignant neoplasm of breast: Secondary | ICD-10-CM | POA: Diagnosis not present

## 2022-04-14 NOTE — Progress Notes (Signed)
abstract

## 2022-05-11 ENCOUNTER — Other Ambulatory Visit: Payer: Self-pay | Admitting: Family

## 2022-05-11 ENCOUNTER — Other Ambulatory Visit: Payer: Self-pay

## 2022-05-11 DIAGNOSIS — I10 Essential (primary) hypertension: Secondary | ICD-10-CM

## 2022-05-11 MED ORDER — OLMESARTAN MEDOXOMIL-HCTZ 40-12.5 MG PO TABS
1.0000 | ORAL_TABLET | Freq: Every day | ORAL | 1 refills | Status: DC
  Filled 2022-05-11: qty 90, 90d supply, fill #0
  Filled 2022-08-10: qty 90, 90d supply, fill #1

## 2022-06-12 ENCOUNTER — Other Ambulatory Visit: Payer: Self-pay

## 2022-07-01 ENCOUNTER — Other Ambulatory Visit: Payer: Self-pay | Admitting: Family

## 2022-07-01 ENCOUNTER — Encounter: Payer: Self-pay | Admitting: Family

## 2022-08-10 ENCOUNTER — Other Ambulatory Visit: Payer: Self-pay | Admitting: Family

## 2022-08-10 ENCOUNTER — Other Ambulatory Visit: Payer: Self-pay

## 2022-08-10 DIAGNOSIS — I10 Essential (primary) hypertension: Secondary | ICD-10-CM

## 2022-08-10 MED ORDER — AMLODIPINE BESYLATE 10 MG PO TABS
10.0000 mg | ORAL_TABLET | Freq: Every day | ORAL | 1 refills | Status: DC
  Filled 2022-08-10: qty 90, 90d supply, fill #0
  Filled 2022-11-04: qty 90, 90d supply, fill #1

## 2022-08-14 DIAGNOSIS — G4733 Obstructive sleep apnea (adult) (pediatric): Secondary | ICD-10-CM | POA: Diagnosis not present

## 2022-09-13 DIAGNOSIS — G4733 Obstructive sleep apnea (adult) (pediatric): Secondary | ICD-10-CM | POA: Diagnosis not present

## 2022-10-07 ENCOUNTER — Ambulatory Visit: Payer: Commercial Managed Care - PPO | Admitting: Obstetrics and Gynecology

## 2022-10-07 ENCOUNTER — Encounter: Payer: Self-pay | Admitting: Obstetrics and Gynecology

## 2022-10-07 VITALS — BP 133/85 | HR 106 | Ht 66.0 in | Wt 231.0 lb

## 2022-10-07 DIAGNOSIS — N951 Menopausal and female climacteric states: Secondary | ICD-10-CM

## 2022-10-07 DIAGNOSIS — Z1339 Encounter for screening examination for other mental health and behavioral disorders: Secondary | ICD-10-CM

## 2022-10-07 DIAGNOSIS — Z7689 Persons encountering health services in other specified circumstances: Secondary | ICD-10-CM

## 2022-10-07 NOTE — Progress Notes (Signed)
New Gyn here to Establish Care.  Discuss hormones.  Last pap:01/26/22 Pt has had Partial Hysterectomy. Notes having different INS now.   Mammogram:04/15/22  CC: Insomnia,weight gain, notes less hot flashes , brain fog.  Fun Fact: Patient likes to read. Patient likes light mystery books.

## 2022-10-07 NOTE — Progress Notes (Signed)
Obstetrics and Gynecology New Patient Evaluation  Appointment Date: 10/07/2022  OBGYN Clinic: [facility]   Primary Care Provider: Allegra Grana  Referring Provider: Allegra Grana, FNP  Chief Complaint:  Chief Complaint  Patient presents with   Establish Care    History of Present Illness: Katrina Roth is a 52 y.o.  G0 (No LMP recorded. Patient has had a hysterectomy.), seen for the above chief complaint.   Patient is doing well. Menopausal symptoms stopped about three years ago. She still struggles with insomnia.   Review of Systems: Pertinent items noted in HPI and remainder of comprehensive ROS otherwise negative.    Patient Active Problem List   Diagnosis Date Noted   OSA (obstructive sleep apnea) 06/20/2021   Snoring 11/19/2020   Headache 09/25/2020   Stage 3a chronic kidney disease (HCC) 03/20/2020   Encounter for screening colonoscopy 12/17/2019   Routine physical examination 09/15/2019   Prediabetes 09/14/2019   Obesity (BMI 35.0-39.9 without comorbidity) 09/14/2019   Hyperlipidemia 09/14/2019   Essential hypertension 03/14/2019    Past Medical History:  Past Medical History:  Diagnosis Date   Diabetes mellitus without complication (HCC)    Fibroids    Hyperlipidemia    Hypertension    Obesity     Past Surgical History:  Past Surgical History:  Procedure Laterality Date   COLONOSCOPY WITH PROPOFOL N/A 10/31/2020   Procedure: COLONOSCOPY WITH PROPOFOL;  Surgeon: Toney Reil, MD;  Location: [facility] ENDOSCOPY;  Service: Gastroenterology;  Laterality: N/A;   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 Left ovary still in situ    Past Obstetrical History:  OB History  Gravida Para Term Preterm AB Living  0 0 0 0 0 0  SAB IAB Ectopic Multiple Live Births  0 0 0 0 0    Past Gynecological History: As per  HPI. Periods: none since her hyst History of Pap Smear(s): Yes.   Last pap 01/2022, which was negative cytology and HPV History of HRT use: No.  Social History:  Social History   Socioeconomic History   Marital status: Single    Spouse name: Not on file   Number of children: Not on file   Years of education: Not on file   Highest education level: Bachelor's degree (e.g., BA, AB, BS)  Occupational History   Occupation: Patient Accounting  Tobacco Use   Smoking status: Never   Smokeless tobacco: Never  Vaping Use   Vaping Use: Never used  Substance and Sexual Activity   Alcohol use: Not Currently   Drug use: No   Sexual activity: Not Currently  Other Topics Concern   Not on file  Social History Narrative   From [place], live alone, no pets       BS degree       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 Nou had no medications administered during this visit. Current Outpatient Medications  Medication Sig Dispense Refill   amLODipine (NORVASC) 10 MG tablet Take 1 tablet (10 mg total) by mouth daily. Needs appt before next refill. 90 tablet 1   GNP GARLIC EXTRACT PO Take by mouth.     Multiple Vitamin (MULTIVITAMIN) tablet Take 1 tablet by mouth daily.     NON FORMULARY Chlorella green algae supplement     NON FORMULARY Green Vibrance probiotic supplement     olmesartan-hydrochlorothiazide (BENICAR HCT) 40-12.5 MG tablet Take 1 tablet by mouth daily. 90 tablet 1   Omega 3 1200 MG CAPS Take by mouth.     Red Yeast  Rice Extract (RED YEAST RICE PO) Take by mouth.     COVID-19 mRNA Vac-TriS, Pfizer, (COMIRNATY) SUSP injection Inject into the muscle. (Patient not taking: Reported on 10/07/2022) 0.3 mL 0   Semaglutide (RYBELSUS) 7 MG TABS Take 1 tablet by mouth daily. 90 tablet 3   Zoster Vaccine Adjuvanted [facility]) injection Inject into the muscle. 1 each 1   No current facility-administered medications for this visit.    Allergies Codeine, Lactose intolerance (gi), and Other   Physical Exam:  BP 133/85   Pulse (!) 106   Ht 5\' 6"  (1.676 m)   Wt 231 lb (104.8 kg)   BMI 37.28 kg/m  Body mass index is 37.28 kg/m. General appearance: Well nourished, well developed female in no acute distress.  Neuro/Psych:  Normal mood and affect.   Laboratory: None  Radiology: None  Assessment: Patient doing well  Plan:  1. Encounter to establish care Mammogram and colonoscopy UTD and patient followed by PCP. Repeat pap smear in late 2026  RTC PRN  Cornelia Copa MD Attending [facility])

## 2022-10-21 ENCOUNTER — Telehealth: Payer: Commercial Managed Care - PPO | Admitting: Physician Assistant

## 2022-10-21 ENCOUNTER — Other Ambulatory Visit: Payer: Self-pay

## 2022-10-21 DIAGNOSIS — J0110 Acute frontal sinusitis, unspecified: Secondary | ICD-10-CM | POA: Diagnosis not present

## 2022-10-21 DIAGNOSIS — G4733 Obstructive sleep apnea (adult) (pediatric): Secondary | ICD-10-CM | POA: Diagnosis not present

## 2022-10-21 MED ORDER — AMOXICILLIN-POT CLAVULANATE 875-125 MG PO TABS
1.0000 | ORAL_TABLET | Freq: Two times a day (BID) | ORAL | 0 refills | Status: DC
  Filled 2022-10-21: qty 14, 7d supply, fill #0

## 2022-10-21 NOTE — Progress Notes (Signed)
Virtual Visit Consent   Katrina Roth, you are scheduled for a virtual visit with a [facility] provider today. Just as with appointments in the office, your consent must be obtained to participate. Your consent will be active for this visit and any virtual visit you may have with one of our providers in the next 365 days. If you have a MyChart account, a copy of this consent can be sent to you electronically.  As this is a virtual visit, video technology does not allow for your provider to perform a traditional examination. This may limit your provider's ability to fully assess your condition. If your provider identifies any concerns that need to be evaluated in person or the need to arrange testing (such as labs, EKG, etc.), we will make arrangements to do so. Although advances in technology are sophisticated, we cannot ensure that it will always work on either your end or our end. If the connection with a video visit is poor, the visit may have to be switched to a telephone visit. With either a video or telephone visit, we are not always able to ensure that we have a secure connection.  By engaging in this virtual visit, you consent to the provision of healthcare and authorize for your insurance to be billed (if applicable) for the services provided during this visit. Depending on your insurance coverage, you may receive a charge related to this service.  I need to obtain your verbal consent now. Are you willing to proceed with your visit today? Katrina Roth has provided verbal consent on 10/21/2022 for a virtual visit (video or telephone). Margaretann Loveless, PA-C  Date: 10/21/2022 3:41 PM  Virtual Visit via Video Note   I, Margaretann Loveless, connected with  Katrina Roth  ([id number], 1970/07/03) on 10/21/22 at  3:30 PM EDT by a video-enabled telemedicine application and verified that I am speaking with the correct person using two identifiers.  Location: Patient: Virtual Visit  Location Patient: Home Provider: Virtual Visit Location Provider: Home Office   I discussed the limitations of evaluation and management by telemedicine and the availability of in person appointments. The patient expressed understanding and agreed to proceed.    History of Present Illness: Katrina Roth is a 52 y.o. who identifies as a female who was assigned female at birth, and is being seen today for possible sinus infection.  HPI: Sinusitis This is a new problem. The current episode started in the past 7 days. There has been no fever. She is experiencing no pain. Associated symptoms include congestion, headaches (all left frontal sinus) and sinus pressure. Pertinent negatives include no chills, coughing, ear pain, hoarse voice or sore throat. Treatments tried: dayquil, peppermint oil, sudafed, vicks vapor rub.    Problems:  Patient Active Problem List   Diagnosis Date Noted   OSA (obstructive sleep apnea) 06/20/2021   Snoring 11/19/2020   Headache 09/25/2020   Stage 3a chronic kidney disease (HCC) 03/20/2020   Encounter for screening colonoscopy 12/17/2019   Routine physical examination 09/15/2019   Prediabetes 09/14/2019   Obesity (BMI 35.0-39.9 without comorbidity) 09/14/2019   Hyperlipidemia 09/14/2019   Essential hypertension 03/14/2019    Allergies:  Allergies  Allergen Reactions   Codeine Other (See Comments)   Lactose Intolerance (Gi) Other (See Comments)    intolerance   Other Other (See Comments)    EGGPLANT   Medications:  Current Outpatient Medications:    amoxicillin-clavulanate (AUGMENTIN) 875-125 MG tablet, Take 1 tablet by  mouth 2 (two) times daily., Disp: 14 tablet, Rfl: 0   amLODipine (NORVASC) 10 MG tablet, Take 1 tablet (10 mg total) by mouth daily. Needs appt before next refill., Disp: 90 tablet, Rfl: 1   COVID-19 mRNA Vac-TriS, Pfizer, (COMIRNATY) SUSP injection, Inject into the muscle. (Patient not taking: Reported on 10/07/2022), Disp: 0.3 mL, Rfl:  0   GNP GARLIC EXTRACT PO, Take by mouth., Disp: , Rfl:    Multiple Vitamin (MULTIVITAMIN) tablet, Take 1 tablet by mouth daily., Disp: , Rfl:    NON FORMULARY, Chlorella green algae supplement, Disp: , Rfl:    NON FORMULARY, Green Vibrance probiotic supplement, Disp: , Rfl:    olmesartan-hydrochlorothiazide (BENICAR HCT) 40-12.5 MG tablet, Take 1 tablet by mouth daily., Disp: 90 tablet, Rfl: 1   Omega 3 1200 MG CAPS, Take by mouth., Disp: , Rfl:    Red Yeast Rice Extract (RED YEAST RICE PO), Take by mouth., Disp: , Rfl:    Semaglutide (RYBELSUS) 7 MG TABS, Take 1 tablet by mouth daily., Disp: 90 tablet, Rfl: 3   Zoster Vaccine Adjuvanted [facility]) injection, Inject into the muscle., Disp: 1 each, Rfl: 1  Observations/Objective: Patient is well-developed, well-nourished in no acute distress.  Resting comfortably at home.  Head is normocephalic, atraumatic.  No labored breathing.  Speech is clear and coherent with logical content.  Patient is alert and oriented at baseline.    Assessment and Plan: 1. Acute non-recurrent frontal sinusitis - amoxicillin-clavulanate (AUGMENTIN) 875-125 MG tablet; Take 1 tablet by mouth 2 (two) times daily.  Dispense: 14 tablet; Refill: 0  - Worsening symptoms that have not responded to OTC medications.  - Will give Augmentin - Continue allergy medications.  - Steam and humidifier can help - Stay well hydrated and get plenty of rest.  - Seek in person evaluation if no symptom improvement or if symptoms worsen   Follow Up Instructions: I discussed the assessment and treatment plan with the patient. The patient was provided an opportunity to ask questions and all were answered. The patient agreed with the plan and demonstrated an understanding of the instructions.  A copy of instructions were sent to the patient via MyChart unless otherwise noted below.    The patient was advised to call back or seek an in-person evaluation if the symptoms worsen or if  the condition fails to improve as anticipated.  Time:  I spent 10 minutes with the patient via telehealth technology discussing the above problems/concerns.    Margaretann Loveless, PA-C

## 2022-10-21 NOTE — Patient Instructions (Signed)
[name], thank you for joining Margaretann Loveless, PA-C for today's virtual visit.  While this provider is not your primary care provider (PCP), if your PCP is located in our provider database this encounter information will be shared with them immediately following your visit.   A [facility] MyChart account gives you access to today's visit and all your visits, tests, and labs performed at [facility] " click here if you don't have a [facility] MyChart account or go to [email or web address]  Consent: (Patient) Katrina Roth provided verbal consent for this virtual visit at the beginning of the encounter.  Current Medications:  Current Outpatient Medications:    amoxicillin-clavulanate (AUGMENTIN) 875-125 MG tablet, Take 1 tablet by mouth 2 (two) times daily., Disp: 14 tablet, Rfl: 0   amLODipine (NORVASC) 10 MG tablet, Take 1 tablet (10 mg total) by mouth daily. Needs appt before next refill., Disp: 90 tablet, Rfl: 1   COVID-19 mRNA Vac-TriS, Pfizer, (COMIRNATY) SUSP injection, Inject into the muscle. (Patient not taking: Reported on 10/07/2022), Disp: 0.3 mL, Rfl: 0   GNP GARLIC EXTRACT PO, Take by mouth., Disp: , Rfl:    Multiple Vitamin (MULTIVITAMIN) tablet, Take 1 tablet by mouth daily., Disp: , Rfl:    NON FORMULARY, Chlorella green algae supplement, Disp: , Rfl:    NON FORMULARY, Green Vibrance probiotic supplement, Disp: , Rfl:    olmesartan-hydrochlorothiazide (BENICAR HCT) 40-12.5 MG tablet, Take 1 tablet by mouth daily., Disp: 90 tablet, Rfl: 1   Omega 3 1200 MG CAPS, Take by mouth., Disp: , Rfl:    Red Yeast Rice Extract (RED YEAST RICE PO), Take by mouth., Disp: , Rfl:    Semaglutide (RYBELSUS) 7 MG TABS, Take 1 tablet by mouth daily., Disp: 90 tablet, Rfl: 3   Zoster Vaccine Adjuvanted [facility]) injection, Inject into the muscle., Disp: 1 each, Rfl: 1   Medications ordered in this encounter:  Meds ordered this encounter  Medications    amoxicillin-clavulanate (AUGMENTIN) 875-125 MG tablet    Sig: Take 1 tablet by mouth 2 (two) times daily.    Dispense:  14 tablet    Refill:  0    Order Specific Question:   Supervising Provider    Answer:   Merrilee Jansky [id number]     *If you need refills on other medications prior to your next appointment, please contact your pharmacy*  Follow-Up: Call back or seek an in-person evaluation if the symptoms worsen or if the condition fails to improve as anticipated.   Virtual Care [phone]  Other Instructions Sinus Infection, Adult A sinus infection, also called sinusitis, is inflammation of your sinuses. Sinuses are hollow spaces in the bones around your face. Your sinuses are located: Around your eyes. In the middle of your forehead. Behind your nose. In your cheekbones. Mucus normally drains out of your sinuses. When your nasal tissues become inflamed or swollen, mucus can become trapped or blocked. This allows bacteria, viruses, and fungi to grow, which leads to infection. Most infections of the sinuses are caused by a virus. A sinus infection can develop quickly. It can last for up to 4 weeks (acute) or for more than 12 weeks (chronic). A sinus infection often develops after a cold. What are the causes? This condition is caused by anything that creates swelling in the sinuses or stops mucus from draining. This includes: Allergies. Asthma. Infection from bacteria or viruses. Deformities or blockages in your nose or sinuses. Abnormal growths in the  nose (nasal polyps). Pollutants, such as chemicals or irritants in the air. Infection from fungi. This is rare. What increases the risk? You are more likely to develop this condition if you: Have a weak body defense system (immune system). Do a lot of swimming or diving. Overuse nasal sprays. Smoke. What are the signs or symptoms? The main symptoms of this condition are pain and a feeling of pressure around  the affected sinuses. Other symptoms include: Stuffy nose or congestion that makes it difficult to breathe through your nose. Thick yellow or greenish drainage from your nose. Tenderness, swelling, and warmth over the affected sinuses. A cough that may get worse at night. Decreased sense of smell and taste. Extra mucus that collects in the throat or the back of the nose (postnasal drip) causing a sore throat or bad breath. Tiredness (fatigue). Fever. How is this diagnosed? This condition is diagnosed based on: Your symptoms. Your medical history. A physical exam. Tests to find out if your condition is acute or chronic. This may include: Checking your nose for nasal polyps. Viewing your sinuses using a device that has a light (endoscope). Testing for allergies or bacteria. Imaging tests, such as an MRI or CT scan. In rare cases, a bone biopsy may be done to rule out more serious types of fungal sinus disease. How is this treated? Treatment for a sinus infection depends on the cause and whether your condition is chronic or acute. If caused by a virus, your symptoms should go away on their own within 10 days. You may be given medicines to relieve symptoms. They include: Medicines that shrink swollen nasal passages (decongestants). A spray that eases inflammation of the nostrils (topical intranasal corticosteroids). Rinses that help get rid of thick mucus in your nose (nasal saline washes). Medicines that treat allergies (antihistamines). Over-the-counter pain relievers. If caused by bacteria, your health care provider may recommend waiting to see if your symptoms improve. Most bacterial infections will get better without antibiotic medicine. You may be given antibiotics if you have: A severe infection. A weak immune system. If caused by narrow nasal passages or nasal polyps, surgery may be needed. Follow these instructions at home: Medicines Take, use, or apply over-the-counter and  prescription medicines only as told by your health care provider. These may include nasal sprays. If you were prescribed an antibiotic medicine, take it as told by your health care provider. Do not stop taking the antibiotic even if you start to feel better. Hydrate and humidify  Drink enough fluid to keep your urine pale yellow. Staying hydrated will help to thin your mucus. Use a cool mist humidifier to keep the humidity level in your home above 50%. Inhale steam for 10-15 minutes, 3-4 times a day, or as told by your health care provider. You can do this in the bathroom while a hot shower is running. Limit your exposure to cool or dry air. Rest Rest as much as possible. Sleep with your head raised (elevated). Make sure you get enough sleep each night. General instructions  Apply a warm, moist washcloth to your face 3-4 times a day or as told by your health care provider. This will help with discomfort. Use nasal saline washes as often as told by your health care provider. Wash your hands often with soap and water to reduce your exposure to germs. If soap and water are not available, use hand sanitizer. Do not smoke. Avoid being around people who are smoking (secondhand smoke). Keep all follow-up  visits. This is important. Contact a health care provider if: You have a fever. Your symptoms get worse. Your symptoms do not improve within 10 days. Get help right away if: You have a severe headache. You have persistent vomiting. You have severe pain or swelling around your face or eyes. You have vision problems. You develop confusion. Your neck is stiff. You have trouble breathing. These symptoms may be an emergency. Get help right away. Call 911. Do not wait to see if the symptoms will go away. Do not drive yourself to the hospital. Summary A sinus infection is soreness and inflammation of your sinuses. Sinuses are hollow spaces in the bones around your face. This condition is caused  by nasal tissues that become inflamed or swollen. The swelling traps or blocks the flow of mucus. This allows bacteria, viruses, and fungi to grow, which leads to infection. If you were prescribed an antibiotic medicine, take it as told by your health care provider. Do not stop taking the antibiotic even if you start to feel better. Keep all follow-up visits. This is important. This information is not intended to replace advice given to you by your health care provider. Make sure you discuss any questions you have with your health care provider. Document Revised: 03/18/2021 Document Reviewed: 03/18/2021 Elsevier Patient Education  2024 Elsevier Inc.    If you have been instructed to have an in-person evaluation today at a local Urgent Care facility, please use the link below. It will take you to a list of all of our available [facility], including address, phone number and hours of operation. Please do not delay care.  [facility]  If you or a family member do not have a primary care provider, use the link below to schedule a visit and establish care. When you choose a [facility] primary care physician or advanced practice provider, you gain a long-term partner in health. Find a Primary Care Provider  Learn more about [facility]'s in-office and virtual care options:  - Get Care Now

## 2022-11-04 ENCOUNTER — Other Ambulatory Visit: Payer: Self-pay

## 2022-11-04 ENCOUNTER — Other Ambulatory Visit: Payer: Self-pay | Admitting: Family

## 2022-11-04 DIAGNOSIS — I10 Essential (primary) hypertension: Secondary | ICD-10-CM

## 2022-11-04 MED ORDER — OLMESARTAN MEDOXOMIL-HCTZ 40-12.5 MG PO TABS
1.0000 | ORAL_TABLET | Freq: Every day | ORAL | 1 refills | Status: DC
  Filled 2022-11-04: qty 30, 30d supply, fill #0

## 2022-11-04 NOTE — Telephone Encounter (Signed)
Spoke to  pt and scheduled fasting lab appt and f/up rx sent in to pharmacy for 30 days

## 2022-11-12 ENCOUNTER — Encounter: Payer: Commercial Managed Care - PPO | Admitting: Obstetrics and Gynecology

## 2022-11-20 DIAGNOSIS — G4733 Obstructive sleep apnea (adult) (pediatric): Secondary | ICD-10-CM | POA: Diagnosis not present

## 2022-11-23 ENCOUNTER — Telehealth: Payer: Self-pay | Admitting: Family

## 2022-11-23 NOTE — Telephone Encounter (Signed)
Patient need lab orders.

## 2022-11-24 ENCOUNTER — Other Ambulatory Visit: Payer: Self-pay | Admitting: Family

## 2022-11-24 DIAGNOSIS — E785 Hyperlipidemia, unspecified: Secondary | ICD-10-CM

## 2022-11-25 ENCOUNTER — Ambulatory Visit: Payer: Commercial Managed Care - PPO | Admitting: Family

## 2022-11-30 ENCOUNTER — Other Ambulatory Visit (INDEPENDENT_AMBULATORY_CARE_PROVIDER_SITE_OTHER): Payer: Commercial Managed Care - PPO

## 2022-11-30 DIAGNOSIS — E785 Hyperlipidemia, unspecified: Secondary | ICD-10-CM

## 2022-11-30 LAB — COMPREHENSIVE METABOLIC PANEL
ALT: 19 U/L (ref 0–35)
AST: 22 U/L (ref 0–37)
Albumin: 4.4 g/dL (ref 3.5–5.2)
Alkaline Phosphatase: 83 U/L (ref 39–117)
BUN: 11 mg/dL (ref 6–23)
CO2: 30 mEq/L (ref 19–32)
Calcium: 9.5 mg/dL (ref 8.4–10.5)
Chloride: 99 mEq/L (ref 96–112)
Creatinine, Ser: 1.01 mg/dL (ref 0.40–1.20)
GFR: 63.7 mL/min (ref >60.00)
Glucose, Bld: 101 mg/dL — ABNORMAL HIGH (ref 70–99)
Potassium: 3.7 mEq/L (ref 3.5–5.1)
Sodium: 139 mEq/L (ref 135–145)
Total Bilirubin: 0.4 mg/dL (ref 0.2–1.2)
Total Protein: 7.5 g/dL (ref 6.0–8.3)

## 2022-11-30 LAB — LIPID PANEL
Cholesterol: 200 mg/dL (ref 0–200)
HDL: 51.4 mg/dL (ref >39.00)
LDL Cholesterol: 117 mg/dL — ABNORMAL HIGH (ref 0–99)
NonHDL: 148.16
Total CHOL/HDL Ratio: 4
Triglycerides: 158 mg/dL — ABNORMAL HIGH (ref 0.0–149.0)
VLDL: 31.6 mg/dL (ref 0.0–40.0)

## 2022-11-30 LAB — HEMOGLOBIN A1C: Hgb A1c MFr Bld: 6.5 % (ref 4.6–6.5)

## 2022-12-04 ENCOUNTER — Ambulatory Visit: Payer: Commercial Managed Care - PPO | Admitting: Family

## 2022-12-04 ENCOUNTER — Encounter: Payer: Self-pay | Admitting: Family

## 2022-12-04 ENCOUNTER — Other Ambulatory Visit: Payer: Self-pay

## 2022-12-04 VITALS — BP 130/76 | HR 93 | Temp 98.5°F | Ht 66.0 in | Wt 231.8 lb

## 2022-12-04 DIAGNOSIS — E119 Type 2 diabetes mellitus without complications: Secondary | ICD-10-CM

## 2022-12-04 DIAGNOSIS — Z7984 Long term (current) use of oral hypoglycemic drugs: Secondary | ICD-10-CM | POA: Diagnosis not present

## 2022-12-04 DIAGNOSIS — I10 Essential (primary) hypertension: Secondary | ICD-10-CM

## 2022-12-04 DIAGNOSIS — Z1231 Encounter for screening mammogram for malignant neoplasm of breast: Secondary | ICD-10-CM | POA: Diagnosis not present

## 2022-12-04 DIAGNOSIS — R7303 Prediabetes: Secondary | ICD-10-CM

## 2022-12-04 LAB — MICROALBUMIN / CREATININE URINE RATIO
Creatinine,U: 94.9 mg/dL
Microalb Creat Ratio: 0.7 mg/g (ref 0.0–30.0)
Microalb, Ur: <0.7 mg/dL (ref 0.0–1.9)

## 2022-12-04 MED ORDER — OLMESARTAN MEDOXOMIL-HCTZ 40-12.5 MG PO TABS
1.0000 | ORAL_TABLET | Freq: Every day | ORAL | 3 refills | Status: DC
  Filled 2022-12-04: qty 90, 90d supply, fill #0
  Filled 2023-03-10: qty 90, 90d supply, fill #1
  Filled 2023-06-08: qty 90, 90d supply, fill #2
  Filled 2023-09-08: qty 90, 90d supply, fill #3

## 2022-12-04 MED ORDER — METFORMIN HCL ER 500 MG PO TB24
500.0000 mg | ORAL_TABLET | Freq: Every day | ORAL | 3 refills | Status: DC
  Filled 2022-12-04: qty 90, 90d supply, fill #0
  Filled 2023-03-03: qty 90, 90d supply, fill #1

## 2022-12-04 MED ORDER — AMLODIPINE BESYLATE 10 MG PO TABS
10.0000 mg | ORAL_TABLET | Freq: Every day | ORAL | 3 refills | Status: DC
  Filled 2022-12-04 – 2023-02-09 (×2): qty 90, 90d supply, fill #0
  Filled 2023-05-11: qty 90, 90d supply, fill #1
  Filled 2023-08-17: qty 90, 90d supply, fill #2
  Filled 2023-11-10: qty 90, 90d supply, fill #3

## 2022-12-04 NOTE — Patient Instructions (Signed)
Start metformin  Please call  and schedule your 3D mammogram and /or bone density scan as we discussed.   [facility]  ( new location in 2023)  [address], [place], [place] [address]  [place], [place]  [phone]

## 2022-12-04 NOTE — Assessment & Plan Note (Signed)
Lab Results  Component Value Date   HGBA1C 6.5 11/30/2022   New diagnosis. Start metformin 500 mg daily and titrate.Discussed goal of LDL < 70 to reduce ASCVD risk. We will repeat in 3 months time.

## 2022-12-04 NOTE — Progress Notes (Signed)
Assessment & Plan:  Encounter for screening mammogram for malignant neoplasm of breast -     3D Screening Mammogram, Left and Right; Future  Essential hypertension Assessment & Plan: Excellent control.  Continue olmesartan-hydrochlorothiazide 40-12.5 mg, amlodipine 10mg  qd  Orders: -     amLODIPine Besylate; Take 1 tablet (10 mg total) by mouth daily. Needs appt before next refill.  Dispense: 90 tablet; Refill: 3 -     Olmesartan Medoxomil-HCTZ; Take 1 tablet by mouth daily.  Dispense: 90 tablet; Refill: 3 -     Lipid panel; Future  Prediabetes Assessment & Plan: Lab Results  Component Value Date   HGBA1C 6.5 11/30/2022   New diagnosis. Start metformin 500 mg daily and titrate.Discussed goal of LDL < 70 to reduce ASCVD risk. We will repeat in 3 months time.   Orders: -     Microalbumin / creatinine urine ratio -     metFORMIN HCl ER; Take 1 tablet (500 mg total) by mouth daily.  Dispense: 90 tablet; Refill: 3 -     Hemoglobin A1c; Future -     Comprehensive metabolic panel; Future  Diabetes mellitus without complication [facility]) Assessment & Plan: Lab Results  Component Value Date   HGBA1C 6.5 11/30/2022   New diagnosis. Start metformin 500 mg daily and titrate.Discussed goal of LDL < 70 to reduce ASCVD risk. We will repeat in 3 months time.       Return precautions given.   Risks, benefits, and alternatives of the medications and treatment plan prescribed today were discussed, and patient expressed understanding.   Education regarding symptom management and diagnosis given to patient on AVS either electronically or printed.  Return in about 3 months (around 03/06/2023).  Rennie Plowman, FNP  Subjective:    Patient ID: Katrina Roth, female    DOB: 1970-08-10, 52 y.o.   MRN: [id number]  CC: Katrina Roth is a 52 y.o. female who presents today for follow up.   HPI: Feels well today No new complaints  She follows healthy diet without processed foods.     DM- she had been on rybelsus, however she did have constipation on medication; she is interested in starting   Allergies: Codeine, Lactose intolerance (gi), and Other Current Outpatient Medications on File Prior to Visit  Medication Sig Dispense Refill   GNP GARLIC EXTRACT PO Take by mouth.     Multiple Vitamin (MULTIVITAMIN) tablet Take 1 tablet by mouth daily.     NON FORMULARY Chlorella green algae supplement     Red Yeast Rice Extract (RED YEAST RICE PO) Take by mouth.     No current facility-administered medications on file prior to visit.    Review of Systems  Constitutional:  Negative for chills and fever.  Respiratory:  Negative for cough.   Cardiovascular:  Negative for chest pain and palpitations.  Gastrointestinal:  Negative for nausea and vomiting.      Objective:    BP 130/76   Pulse 93   Temp 98.5 F (36.9 C) (Oral)   Ht 5\' 6"  (1.676 m)   Wt 231 lb 12.8 oz (105.1 kg)   SpO2 98%   BMI 37.41 kg/m  BP Readings from Last 3 Encounters:  12/04/22 130/76  10/07/22 133/85  01/26/22 128/78   Wt Readings from Last 3 Encounters:  12/04/22 231 lb 12.8 oz (105.1 kg)  10/07/22 231 lb (104.8 kg)  01/26/22 227 lb 3.2 oz (103.1 kg)    Physical Exam Vitals reviewed.  Constitutional:  Appearance: She is well-developed.  Eyes:     Conjunctiva/sclera: Conjunctivae normal.  Cardiovascular:     Rate and Rhythm: Normal rate and regular rhythm.     Pulses: Normal pulses.     Heart sounds: Normal heart sounds.  Pulmonary:     Effort: Pulmonary effort is normal.     Breath sounds: Normal breath sounds. No wheezing, rhonchi or rales.  Skin:    General: Skin is warm and dry.  Neurological:     Mental Status: She is alert.  Psychiatric:        Speech: Speech normal.        Behavior: Behavior normal.        Thought Content: Thought content normal.

## 2022-12-04 NOTE — Assessment & Plan Note (Signed)
Excellent control.  Continue olmesartan-hydrochlorothiazide 40-12.5 mg, amlodipine 10mg  qd

## 2023-02-09 ENCOUNTER — Other Ambulatory Visit: Payer: Self-pay

## 2023-02-10 DIAGNOSIS — G4733 Obstructive sleep apnea (adult) (pediatric): Secondary | ICD-10-CM | POA: Diagnosis not present

## 2023-02-11 ENCOUNTER — Other Ambulatory Visit: Payer: Self-pay

## 2023-02-11 MED ORDER — FLULAVAL 0.5 ML IM SUSY
PREFILLED_SYRINGE | INTRAMUSCULAR | 0 refills | Status: DC
  Filled 2023-02-11: qty 0.5, 1d supply, fill #0

## 2023-03-09 ENCOUNTER — Other Ambulatory Visit (INDEPENDENT_AMBULATORY_CARE_PROVIDER_SITE_OTHER): Payer: Commercial Managed Care - PPO

## 2023-03-09 DIAGNOSIS — R7303 Prediabetes: Secondary | ICD-10-CM | POA: Diagnosis not present

## 2023-03-09 DIAGNOSIS — I10 Essential (primary) hypertension: Secondary | ICD-10-CM

## 2023-03-09 LAB — LIPID PANEL
Cholesterol: 194 mg/dL (ref 0–200)
HDL: 51.8 mg/dL (ref >39.00)
LDL Cholesterol: 123 mg/dL — ABNORMAL HIGH (ref 0–99)
NonHDL: 142.32
Total CHOL/HDL Ratio: 4
Triglycerides: 96 mg/dL (ref 0.0–149.0)
VLDL: 19.2 mg/dL (ref 0.0–40.0)

## 2023-03-09 LAB — COMPREHENSIVE METABOLIC PANEL
ALT: 13 U/L (ref 0–35)
AST: 22 U/L (ref 0–37)
Albumin: 4.3 g/dL (ref 3.5–5.2)
Alkaline Phosphatase: 83 U/L (ref 39–117)
BUN: 12 mg/dL (ref 6–23)
CO2: 32 meq/L (ref 19–32)
Calcium: 9.8 mg/dL (ref 8.4–10.5)
Chloride: 100 meq/L (ref 96–112)
Creatinine, Ser: 1.22 mg/dL — ABNORMAL HIGH (ref 0.40–1.20)
GFR: 50.69 mL/min — ABNORMAL LOW (ref >60.00)
Glucose, Bld: 114 mg/dL — ABNORMAL HIGH (ref 70–99)
Potassium: 3.9 meq/L (ref 3.5–5.1)
Sodium: 140 meq/L (ref 135–145)
Total Bilirubin: 0.5 mg/dL (ref 0.2–1.2)
Total Protein: 7.6 g/dL (ref 6.0–8.3)

## 2023-03-09 LAB — HEMOGLOBIN A1C: Hgb A1c MFr Bld: 6.6 % — ABNORMAL HIGH (ref 4.6–6.5)

## 2023-03-12 ENCOUNTER — Encounter: Payer: Self-pay | Admitting: Family

## 2023-03-12 ENCOUNTER — Ambulatory Visit: Payer: Commercial Managed Care - PPO | Admitting: Family

## 2023-03-12 VITALS — BP 130/68 | HR 111 | Temp 98.0°F | Ht 66.0 in | Wt 229.4 lb

## 2023-03-12 DIAGNOSIS — I10 Essential (primary) hypertension: Secondary | ICD-10-CM

## 2023-03-12 DIAGNOSIS — N1831 Chronic kidney disease, stage 3a: Secondary | ICD-10-CM | POA: Diagnosis not present

## 2023-03-12 DIAGNOSIS — E785 Hyperlipidemia, unspecified: Secondary | ICD-10-CM

## 2023-03-12 DIAGNOSIS — E119 Type 2 diabetes mellitus without complications: Secondary | ICD-10-CM | POA: Diagnosis not present

## 2023-03-12 DIAGNOSIS — Z7984 Long term (current) use of oral hypoglycemic drugs: Secondary | ICD-10-CM

## 2023-03-12 DIAGNOSIS — R7309 Other abnormal glucose: Secondary | ICD-10-CM

## 2023-03-12 NOTE — Assessment & Plan Note (Signed)
Excellent control.  Continue olmesartan-hydrochlorothiazide 40-12.5 mg, amlodipine 10mg  qd

## 2023-03-12 NOTE — Progress Notes (Signed)
Assessment & Plan:  Elevated glucose  Diabetes mellitus without complication [facility]) Assessment & Plan: Lab Results  Component Value Date   HGBA1C 6.6 (H) 03/09/2023   Slight increase.  We discussed dietary modifications including creating caloric deficit.  We also discussed increasing metformin from 500 mg daily to 500 mg twice daily with meals to see if this suppresses appetite.  Patient will consider this in the future.  Orders: -     Comprehensive metabolic panel; Future -     Hemoglobin A1c; Future -     Lipid panel; Future  Essential hypertension Assessment & Plan: Excellent control.  Continue olmesartan-hydrochlorothiazide 40-12.5 mg, amlodipine 10mg  qd   Stage 3a chronic kidney disease (HCC) Assessment & Plan: Lab Results  Component Value Date   CREATININE 1.22 (H) 03/09/2023   Chronic kidney disease overall stable, slight increase with recent labs.  Reiterated the importance of continued follow-up with nephrology.  She will call to schedule      Return precautions given.   Risks, benefits, and alternatives of the medications and treatment plan prescribed today were discussed, and patient expressed understanding.   Education regarding symptom management and diagnosis given to patient on AVS either electronically or printed.  Return in about 3 months (around 06/12/2023).  Rennie Plowman, FNP  Subjective:    Patient ID: Katrina Roth, female    DOB: 10-18-1970, 52 y.o.   MRN: [id number]  CC: Katrina Roth is a 52 y.o. female who presents today for follow up.   HPI: Follow-up diabetes She is compliant metformin 500 mg daily   breakfast is apple and protein bar ( quest), shredded wheat.  Coffee with non diary creamer Lunch is protein shake, yogurt with granola and apple Dinner is kale, quinoa, lentils. Tofu  No regular exercise     She has seen nutritionist in the past  Allergies: Codeine, Lactose intolerance (gi), and Other Current Outpatient  Medications on File Prior to Visit  Medication Sig Dispense Refill   amLODipine (NORVASC) 10 MG tablet Take 1 tablet (10 mg total) by mouth daily. Needs appt before next refill. 90 tablet 3   metFORMIN (GLUCOPHAGE-XR) 500 MG 24 hr tablet Take 1 tablet (500 mg total) by mouth daily. 90 tablet 3   NON FORMULARY Chlorella green algae supplement     olmesartan-hydrochlorothiazide (BENICAR HCT) 40-12.5 MG tablet Take 1 tablet by mouth daily. 90 tablet 3   GNP GARLIC EXTRACT PO Take by mouth. (Patient not taking: Reported on 03/12/2023)     influenza vac split trivalent PF (FLULAVAL) 0.5 ML injection Inject into the muscle. (Patient not taking: Reported on 03/12/2023) 0.5 mL 0   Multiple Vitamin (MULTIVITAMIN) tablet Take 1 tablet by mouth daily. (Patient not taking: Reported on 03/12/2023)     Red Yeast Rice Extract (RED YEAST RICE PO) Take by mouth. (Patient not taking: Reported on 03/12/2023)     No current facility-administered medications on file prior to visit.    Review of Systems  Constitutional:  Negative for chills and fever.  Respiratory:  Negative for cough.   Cardiovascular:  Negative for chest pain and palpitations.  Gastrointestinal:  Negative for nausea and vomiting.      Objective:    BP 130/68   Pulse (!) 111   Temp 98 F (36.7 C) (Oral)   Ht 5\' 6"  (1.676 m)   Wt 229 lb 6.4 oz (104.1 kg)   SpO2 96%   BMI 37.03 kg/m  BP Readings from Last 3  Encounters:  03/12/23 130/68  12/04/22 130/76  10/07/22 133/85   Wt Readings from Last 3 Encounters:  03/12/23 229 lb 6.4 oz (104.1 kg)  12/04/22 231 lb 12.8 oz (105.1 kg)  10/07/22 231 lb (104.8 kg)    Physical Exam Vitals reviewed.  Constitutional:      Appearance: She is well-developed.  Eyes:     Conjunctiva/sclera: Conjunctivae normal.  Cardiovascular:     Rate and Rhythm: Normal rate and regular rhythm.     Pulses: Normal pulses.     Heart sounds: Normal heart sounds.  Pulmonary:     Effort: Pulmonary effort  is normal.     Breath sounds: Normal breath sounds. No wheezing, rhonchi or rales.  Skin:    General: Skin is warm and dry.  Neurological:     Mental Status: She is alert.  Psychiatric:        Speech: Speech normal.        Behavior: Behavior normal.        Thought Content: Thought content normal.

## 2023-03-12 NOTE — Assessment & Plan Note (Signed)
Lab Results  Component Value Date   HGBA1C 6.6 (H) 03/09/2023   Slight increase.  We discussed dietary modifications including creating caloric deficit.  We also discussed increasing metformin from 500 mg daily to 500 mg twice daily with meals to see if this suppresses appetite.  Patient will consider this in the future.

## 2023-03-12 NOTE — Patient Instructions (Signed)
Please download Myfitness Pal App ( basic version is free).   You may log every thing you eat for even 2-3 days to get a better of idea of total daily calories. To loose weight, we have to create caloric deficit to loose weight. The goal is 1-2 lbs per week of weight loss.   Excellent article below from [facility].   https://www.health.CriticalZ.it  Calorie counting made easy  Eat less, exercise more. If only it were that simple! As most dieters know, losing weight can be very challenging. As this report details, a range of influences can affect how people gain and lose weight. But a basic understanding of how to tip your energy balance in favor of weight loss is a good place to start.  Start by determining how many calories you should consume each day. To do so, you need to know how many calories you need to maintain your current weight. Doing this requires a few simple calculations.  First, multiply your current weight by 15 -- that's roughly the number of calories per pound of body weight needed to maintain your current weight if you are moderately active. Moderately active means getting at least 30 minutes of physical activity a day in the form of exercise (walking at a brisk pace, climbing stairs, or active gardening). Let's say you're a woman who is 5 feet, 4 inches tall and weighs 155 pounds, and you need to lose about 15 pounds to put you in a healthy weight range. If you multiply 155 by 15, you will get 2,325, which is the number of calories per day that you need in order to maintain your current weight (weight-maintenance calories). To lose weight, you will need to get below that total.  For example, to lose 1 to 2 pounds a week -- a rate that experts consider safe -- your food consumption should provide 500 to 1,000 calories less than your total weight-maintenance calories. If you need 2,325 calories a day to maintain your current weight,  reduce your daily calories to between 1,325 and 1,825. If you are sedentary, you will also need to build more activity into your day. In order to lose at least a pound a week, try to do at least 30 minutes of physical activity on most days, and reduce your daily calorie intake by at least 500 calories. However, calorie intake should not fall below 1,200 a day in women or 1,500 a day in men, except under the supervision of a health professional. Eating too few calories can endanger your health by depriving you of needed nutrients.  Meeting your calorie target How can you meet your daily calorie target? One approach is to add up the number of calories per serving of all the foods that you eat, and then plan your menus accordingly. You can buy books that list calories per serving for many foods. In addition, the nutrition labels on all packaged foods and beverages provide calories per serving information. Make a point of reading the labels of the foods and drinks you use, noting the number of calories and the serving sizes. Many recipes published in cookbooks, newspapers, and magazines provide similar information.  If you hate counting calories, a different approach is to restrict how much and how often you eat, and to eat meals that are low in calories. Dietary guidelines issued by the American Heart Association stress common sense in choosing your foods rather than focusing strictly on numbers, such as total calories or calories from fat.  Whichever method you choose, research shows that a regular eating schedule -- with meals and snacks planned for certain times each day -- makes for the most successful approach. The same applies after you have lost weight and want to keep it off. Sticking with an eating schedule increases your chance of maintaining your new weight.    This is  Dr. Melina Schools  ( an amazing physician in my office!)  example of a  "Low GI"  Diet:  It will allow you to lose 4 to 8  lbs  per month  if you follow it carefully.  Your goal with exercise is a minimum of 30 minutes of aerobic exercise 5 days per week (Walking does not count once it becomes easy!)    All of the foods can be found at grocery stores and in bulk at [employer].  The Atkins protein bars and shakes are available in more varieties at Target, [employer] and [employer]'s Foods.     7 AM Breakfast:  Choose from the following:  Low carbohydrate Protein  Shakes (I recommend the  Premier Protein chocolate shakes,  EAS AdvantEdge "Carb Control" shakes  Or the Atkins shakes all are under 3 net carbs)     a scrambled egg/bacon/cheese burrito made with Mission's "carb balance" whole wheat tortilla  (about 10 net carbs )  Medical laboratory scientific officer (basically a quiche without the pastry crust) that is eaten cold and very convenient way to get your eggs.  8 carbs)  If you make your own protein shakes, avoid bananas and pineapple,  And use low carb greek yogurt or original /unsweetened almond or soy milk    Avoid cereal and bananas, oatmeal and cream of wheat and grits. They are loaded with carbohydrates!   10 AM: high protein snack:  Protein bar by Atkins (the snack size, under 200 cal, usually < 6 net carbs).    A stick of cheese:  Around 1 carb,  100 cal     Dannon Light n Fit [place] Yogurt  (80 cal, 8 carbs)  Other so called "protein bars" and Greek yogurts tend to be loaded with carbohydrates.  Remember, in food advertising, the word "energy" is synonymous for " carbohydrate."  Lunch:   A Sandwich using the bread choices listed, Can use any  Eggs,  lunchmeat, grilled meat or canned tuna), avocado, regular mayo/mustard  and cheese.  A Salad using blue cheese, ranch,  Goddess or vinagrette,  Avoid taco shells, croutons or "confetti" and no "candied nuts" but regular nuts OK.   No pretzels, nabs  or chips.  Pickles and miniature sweet peppers are a good low carb alternative that provide a "crunch"  The bread is the only  source of carbohydrate in a sandwich and  can be decreased by trying some of the attached alternatives to traditional loaf bread   Avoid "Low fat dressings, as well as Reyne Dumas and [employer] dressings They are loaded with sugar!   3 PM/ Mid day  Snack:  Consider  1 ounce of  almonds, walnuts, pistachios, pecans, peanuts,  Macadamia nuts or a nut medley.  Avoid "granola and granola bars "  Mixed nuts are ok in moderation as long as there are no raisins,  cranberries or dried fruit.   KIND bars are OK if you get the low glycemic index variety   Try the prosciutto/mozzarella cheese sticks by Fiorruci  In deli /backery section   High protein  6 PM  Dinner:     Meat/fowl/fish with a green salad, and either broccoli, cauliflower, green beans, spinach, brussel sprouts or  Lima beans. DO NOT BREAD THE PROTEIN!!      There is a low carb pasta by Dreamfield's that is acceptable and tastes great: only 5 digestible carbs/serving.( All grocery stores but BJs carry it ) Several ready made meals are available low carb:   Try Michel Angelo's chicken piccata or chicken or eggplant parm over low carb pasta.(Lowes and BJs)   Clifton Custard Sanchez's "Carnitas" (pulled pork, no sauce,  0 carbs) or his beef pot roast to make a dinner burrito (at [employer]J's)  Pesto over low carb pasta (bj's sells a good quality pesto in the center refrigerated section of the deli   Try satueeing  Roosvelt Harps with mushroooms as a good side   [employer] Giant makes a mashed cauliflower that tastes like mashed potatoes  Whole wheat pasta is still full of digestible carbs and  Not as low in glycemic index as Dreamfield's.   Brown rice is still rice,  So skip the rice and noodles if you eat [place] or [place] (or at least limit to 1/2 cup)  9 PM snack :   Breyer's "low carb" fudgsicle or  ice cream bar (Carb Smart line), or  Weight Watcher's ice cream bar , or another "no sugar added" ice cream;  a serving of fresh berries/cherries with whipped  cream   Cheese or DANNON'S LlGHT N FIT GREEK YOGURT  8 ounces of [place] Diamond unsweetened almond/cococunut milk    Treat yourself to a parfait made with whipped cream blueberiies, walnuts and vanilla greek yogurt  Avoid bananas, pineapple, grapes  and watermelon on a regular basis because they are high in sugar.  THINK OF THEM AS DESSERT  Remember that snack Substitutions should be less than 10 NET carbs per serving and meals < 20 carbs. Remember to subtract fiber grams to get the "net carbs."

## 2023-03-12 NOTE — Assessment & Plan Note (Addendum)
The 10-year ASCVD risk score (Katrina Roth DK, et al., 2019) is: 10.9% Lab Results  Component Value Date   LDLCALC 123 (H) 03/09/2023   Uncontrolled. Discussed elevation ASCVD risk  in particular in the setting of diabetes , advised LDL less than 70.  Patient politely declined statin at this time.  Will continue to discuss

## 2023-03-12 NOTE — Assessment & Plan Note (Signed)
Lab Results  Component Value Date   CREATININE 1.22 (H) 03/09/2023   Chronic kidney disease overall stable, slight increase with recent labs.  Reiterated the importance of continued follow-up with nephrology.  She will call to schedule

## 2023-03-13 DIAGNOSIS — G4733 Obstructive sleep apnea (adult) (pediatric): Secondary | ICD-10-CM | POA: Diagnosis not present

## 2023-03-15 ENCOUNTER — Other Ambulatory Visit: Payer: Self-pay | Admitting: Family

## 2023-03-15 ENCOUNTER — Other Ambulatory Visit: Payer: Self-pay

## 2023-03-15 DIAGNOSIS — R7303 Prediabetes: Secondary | ICD-10-CM

## 2023-03-15 MED ORDER — METFORMIN HCL ER 500 MG PO TB24
500.0000 mg | ORAL_TABLET | Freq: Two times a day (BID) | ORAL | 3 refills | Status: DC
  Filled 2023-03-15 – 2023-05-19 (×2): qty 180, 90d supply, fill #0
  Filled 2023-08-17: qty 180, 90d supply, fill #1
  Filled 2024-02-07: qty 180, 90d supply, fill #2

## 2023-03-22 ENCOUNTER — Other Ambulatory Visit: Payer: Self-pay

## 2023-03-22 DIAGNOSIS — H52223 Regular astigmatism, bilateral: Secondary | ICD-10-CM | POA: Diagnosis not present

## 2023-03-22 DIAGNOSIS — H524 Presbyopia: Secondary | ICD-10-CM | POA: Diagnosis not present

## 2023-03-22 DIAGNOSIS — H5203 Hypermetropia, bilateral: Secondary | ICD-10-CM | POA: Diagnosis not present

## 2023-03-22 LAB — HM DIABETES EYE EXAM

## 2023-03-22 MED ORDER — COMIRNATY 30 MCG/0.3ML IM SUSY
0.3000 mL | PREFILLED_SYRINGE | INTRAMUSCULAR | 0 refills | Status: AC
  Filled 2023-03-22: qty 0.3, 1d supply, fill #0

## 2023-04-12 DIAGNOSIS — G4733 Obstructive sleep apnea (adult) (pediatric): Secondary | ICD-10-CM | POA: Diagnosis not present

## 2023-04-16 ENCOUNTER — Ambulatory Visit
Admission: RE | Admit: 2023-04-16 | Discharge: 2023-04-16 | Disposition: A | Discharge status: Home / Self Care | Payer: Commercial Managed Care - PPO | Source: Ambulatory Visit | Attending: Family | Admitting: Family

## 2023-04-16 DIAGNOSIS — Z1231 Encounter for screening mammogram for malignant neoplasm of breast: Secondary | ICD-10-CM

## 2023-05-11 ENCOUNTER — Other Ambulatory Visit: Payer: Self-pay

## 2023-05-19 ENCOUNTER — Other Ambulatory Visit: Payer: Self-pay

## 2023-05-26 ENCOUNTER — Encounter: Payer: Self-pay | Admitting: Family

## 2023-05-31 ENCOUNTER — Other Ambulatory Visit: Payer: Self-pay | Admitting: Family

## 2023-05-31 DIAGNOSIS — E669 Obesity, unspecified: Secondary | ICD-10-CM

## 2023-06-04 NOTE — Telephone Encounter (Signed)
 Done

## 2023-06-07 ENCOUNTER — Other Ambulatory Visit (INDEPENDENT_AMBULATORY_CARE_PROVIDER_SITE_OTHER): Payer: Managed Care, Other (non HMO)

## 2023-06-07 DIAGNOSIS — E669 Obesity, unspecified: Secondary | ICD-10-CM | POA: Diagnosis not present

## 2023-06-07 DIAGNOSIS — E119 Type 2 diabetes mellitus without complications: Secondary | ICD-10-CM

## 2023-06-07 LAB — COMPREHENSIVE METABOLIC PANEL
ALT: 12 U/L (ref 0–35)
AST: 19 U/L (ref 0–37)
Albumin: 4.3 g/dL (ref 3.5–5.2)
Alkaline Phosphatase: 87 U/L (ref 39–117)
BUN: 12 mg/dL (ref 6–23)
CO2: 34 meq/L — ABNORMAL HIGH (ref 19–32)
Calcium: 9.3 mg/dL (ref 8.4–10.5)
Chloride: 99 meq/L (ref 96–112)
Creatinine, Ser: 0.98 mg/dL (ref 0.40–1.20)
GFR: 65.81 mL/min (ref >60.00)
Glucose, Bld: 103 mg/dL — ABNORMAL HIGH (ref 70–99)
Potassium: 4.1 meq/L (ref 3.5–5.1)
Sodium: 141 meq/L (ref 135–145)
Total Bilirubin: 0.4 mg/dL (ref 0.2–1.2)
Total Protein: 7.4 g/dL (ref 6.0–8.3)

## 2023-06-07 LAB — HEMOGLOBIN A1C: Hgb A1c MFr Bld: 6.4 % (ref 4.6–6.5)

## 2023-06-07 LAB — LIPID PANEL
Cholesterol: 180 mg/dL (ref 0–200)
HDL: 61.1 mg/dL (ref >39.00)
LDL Cholesterol: 100 mg/dL — ABNORMAL HIGH (ref 0–99)
NonHDL: 119.18
Total CHOL/HDL Ratio: 3
Triglycerides: 96 mg/dL (ref 0.0–149.0)
VLDL: 19.2 mg/dL (ref 0.0–40.0)

## 2023-06-08 LAB — INSULIN, RANDOM: Insulin: 9.6 u[IU]/mL

## 2023-06-14 ENCOUNTER — Ambulatory Visit (INDEPENDENT_AMBULATORY_CARE_PROVIDER_SITE_OTHER): Payer: Managed Care, Other (non HMO) | Admitting: Family

## 2023-06-14 ENCOUNTER — Encounter: Payer: Self-pay | Admitting: Family

## 2023-06-14 VITALS — BP 130/70 | HR 100 | Temp 98.4°F | Ht 66.0 in | Wt 220.4 lb

## 2023-06-14 DIAGNOSIS — I10 Essential (primary) hypertension: Secondary | ICD-10-CM

## 2023-06-14 DIAGNOSIS — E669 Obesity, unspecified: Secondary | ICD-10-CM

## 2023-06-14 DIAGNOSIS — E119 Type 2 diabetes mellitus without complications: Secondary | ICD-10-CM

## 2023-06-14 DIAGNOSIS — Z7984 Long term (current) use of oral hypoglycemic drugs: Secondary | ICD-10-CM | POA: Diagnosis not present

## 2023-06-14 NOTE — Assessment & Plan Note (Addendum)
 Congratulated patient on 11 pound weight loss.  Encouraged continued lifestyle changes.  She is doing an excellent job.  Pending hormone labs to evaluate for hormone deficiency

## 2023-06-14 NOTE — Patient Instructions (Signed)
 You are doing an amazing job- keep up the great work!

## 2023-06-14 NOTE — Progress Notes (Signed)
 Assessment & Plan:  Obesity (BMI 35.0-39.9 without comorbidity) Assessment & Plan: Congratulated patient on 11 pound weight loss.  Encouraged continued lifestyle changes.  She is doing an excellent job.  Pending hormone labs to evaluate for hormone deficiency  Orders: -     Cortisol-am, blood; Future -     TSH; Future -     Follicle stimulating hormone; Future -     Testosterone , Free and Total; Future  Diabetes mellitus without complication (HCC) Assessment & Plan: Excellent control.  Continue metformin 500 mg twice daily   Essential hypertension Assessment & Plan: Excellent control.  Continue olmesartan-hydrochlorothiazide 40-12.5 mg, amlodipine 10mg  qd      Return precautions given.   Risks, benefits, and alternatives of the medications and treatment plan prescribed today were discussed, and patient expressed understanding.   Education regarding symptom management and diagnosis given to patient on AVS either electronically or printed.  Return in about 4 months (around 10/12/2023).  Rennie Plowman, FNP  Subjective:    Patient ID: Katrina Roth, female    DOB: 1970-05-25, 53 y.o.   MRN: [id number]  CC: Katrina Roth is a 53 y.o. female who presents today for follow up.   HPI: Hot flashes started after hysterectomy in her 30's.   Hot flashes have significantly improved and she describes as 'rare.'  She endorses brain fog and muscle weakness.   She is doing step work out, dance work out 5-6 days/week  Her strength is not where is had been in the past.   She is interested in more hormonal evaluation, including testosterone          Allergies: Codeine, Lactose intolerance (gi), and Other Current Outpatient Medications on File Prior to Visit  Medication Sig Dispense Refill   amLODipine (NORVASC) 10 MG tablet Take 1 tablet (10 mg total) by mouth daily. Needs appt before next refill. 90 tablet 3   COVID-19 mRNA vaccine, Pfizer, (COMIRNATY) syringe  Inject 0.3 mLs into the muscle. 0.3 mL 0   metFORMIN (GLUCOPHAGE-XR) 500 MG 24 hr tablet Take 1 tablet (500 mg total) by mouth 2 (two) times daily with a meal. 180 tablet 3   NON FORMULARY Chlorella green algae supplement     olmesartan-hydrochlorothiazide (BENICAR HCT) 40-12.5 MG tablet Take 1 tablet by mouth daily. 90 tablet 3   Red Yeast Rice Extract (RED YEAST RICE PO) Take by mouth.     No current facility-administered medications on file prior to visit.    Review of Systems  Constitutional:  Negative for chills and fever.  Respiratory:  Negative for cough.   Cardiovascular:  Negative for chest pain and palpitations.  Gastrointestinal:  Negative for nausea and vomiting.      Objective:    BP 130/70   Pulse 100   Temp 98.4 F (36.9 C) (Oral)   Ht 5\' 6"  (1.676 m)   Wt 220 lb 6.4 oz (100 kg)   SpO2 98%   BMI 35.57 kg/m  BP Readings from Last 3 Encounters:  06/14/23 130/70  03/12/23 130/68  12/04/22 130/76   Wt Readings from Last 3 Encounters:  06/14/23 220 lb 6.4 oz (100 kg)  03/12/23 229 lb 6.4 oz (104.1 kg)  12/04/22 231 lb 12.8 oz (105.1 kg)      06/14/2023    8:57 AM 03/12/2023    9:15 AM 03/12/2023    9:06 AM  Depression screen PHQ 2/9  Decreased Interest 0 0 0  Down, Depressed, Hopeless 0 0  0  PHQ - 2 Score 0 0 0    Physical Exam Vitals reviewed.  Constitutional:      Appearance: She is well-developed.  Eyes:     Conjunctiva/sclera: Conjunctivae normal.  Cardiovascular:     Rate and Rhythm: Normal rate and regular rhythm.     Pulses: Normal pulses.     Heart sounds: Normal heart sounds.  Pulmonary:     Effort: Pulmonary effort is normal.     Breath sounds: Normal breath sounds. No wheezing, rhonchi or rales.  Skin:    General: Skin is warm and dry.  Neurological:     Mental Status: She is alert.  Psychiatric:        Speech: Speech normal.        Behavior: Behavior normal.        Thought Content: Thought content normal.

## 2023-06-14 NOTE — Assessment & Plan Note (Signed)
Excellent control.  Continue olmesartan-hydrochlorothiazide 40-12.5 mg, amlodipine 10mg  qd

## 2023-06-14 NOTE — Assessment & Plan Note (Signed)
 Excellent control.  Continue metformin 500 mg twice daily

## 2023-07-20 ENCOUNTER — Other Ambulatory Visit (HOSPITAL_COMMUNITY): Payer: Self-pay

## 2023-07-22 ENCOUNTER — Other Ambulatory Visit: Payer: Managed Care, Other (non HMO)

## 2023-07-30 ENCOUNTER — Other Ambulatory Visit (HOSPITAL_COMMUNITY): Payer: Self-pay

## 2023-10-06 ENCOUNTER — Telehealth: Payer: Self-pay | Admitting: Family

## 2023-10-06 NOTE — Telephone Encounter (Signed)
 Bascom Bossier, FNP will not be in the office on the day of your next scheduled visit (10/13/23). Please call the office back at [phone] to reschedule.   Thank you  LMOM and sent above message via MyChart for pt to call back and reschedule appt. E2C2, please reschedule with pt when they call back. [facility]

## 2023-10-07 ENCOUNTER — Telehealth: Payer: Self-pay

## 2023-10-07 DIAGNOSIS — Z0000 Encounter for general adult medical examination without abnormal findings: Secondary | ICD-10-CM

## 2023-10-07 DIAGNOSIS — E785 Hyperlipidemia, unspecified: Secondary | ICD-10-CM

## 2023-10-07 DIAGNOSIS — I10 Essential (primary) hypertension: Secondary | ICD-10-CM

## 2023-10-07 DIAGNOSIS — E669 Obesity, unspecified: Secondary | ICD-10-CM

## 2023-10-07 NOTE — Telephone Encounter (Signed)
 Copied from CRM [id number]. Topic: Clinical - Request for Lab/Test Order >> Oct 07, 2023  2:32 PM [place] wrote: Reason for CRM: Patient wanting to know if she should get a lab order for regular blood work to cover lipid and FSH and testosterone.

## 2023-10-11 NOTE — Addendum Note (Signed)
 Addended by: Calista Catching on: 10/11/2023 10:33 AM   Modules accepted: Orders

## 2023-10-18 ENCOUNTER — Ambulatory Visit: Payer: Managed Care, Other (non HMO) | Admitting: Family

## 2023-10-25 ENCOUNTER — Ambulatory Visit: Admitting: Family

## 2023-10-28 ENCOUNTER — Emergency Department

## 2023-10-28 ENCOUNTER — Other Ambulatory Visit: Payer: Self-pay

## 2023-10-28 DIAGNOSIS — M25561 Pain in right knee: Secondary | ICD-10-CM | POA: Diagnosis present

## 2023-10-28 NOTE — ED Triage Notes (Signed)
 Pt c/o right knee pain x 2 days after exercise, pt reports she has been having pain and today heard a pop and then was unable to bear weight.

## 2023-10-29 ENCOUNTER — Emergency Department
Admission: EM | Admit: 2023-10-29 | Discharge: 2023-10-29 | Disposition: A | Discharge status: Home / Self Care | Attending: Emergency Medicine | Admitting: Emergency Medicine

## 2023-10-29 DIAGNOSIS — M25561 Pain in right knee: Secondary | ICD-10-CM

## 2023-10-29 NOTE — ED Provider Notes (Signed)
 [facility] Provider Note    Event Date/Time   First MD Initiated Contact with Patient 10/29/23 0013     (approximate)   History   Knee Pain   HPI Katrina Roth is a 53 y.o. female who presents for evaluation of acute onset pain in her right knee.  She says she had a twinge a couple of days ago but was able to bear weight and use it normally.  Tonight she thinks she twisted with her foot planted and then felt a pop in the right knee.  Since that time she feels unable to bear weight.  She denies having any swelling in the knee.  No numbness or tingling distally.  No other injuries.  No similar issues in the past.     Physical Exam   Triage Vital Signs: ED Triage Vitals  Encounter Vitals Group     BP 10/28/23 2308 139/82     Girls Systolic BP Percentile --      Girls Diastolic BP Percentile --      Boys Systolic BP Percentile --      Boys Diastolic BP Percentile --      Pulse Rate 10/28/23 2308 (!) 104     Resp 10/28/23 2308 18     Temp 10/28/23 2308 98.4 F (36.9 C)     Temp src --      SpO2 10/28/23 2308 100 %     Weight 10/28/23 2307 97.5 kg (215 lb)     Height 10/28/23 2307 1.676 m (5' 6)     Head Circumference --      Peak Flow --      Pain Score 10/28/23 2307 7     Pain Loc --      Pain Education --      Exclude from Growth Chart --     Most recent vital signs: Vitals:   10/28/23 2308 10/29/23 0035  BP: 139/82 121/73  Pulse: (!) 104 88  Resp: 18   Temp: 98.4 F (36.9 C)   SpO2: 100% 97%    General: Awake, no distress.  CV:  Good peripheral perfusion.  Resp:  Normal effort. Speaking easily and comfortably, no accessory muscle usage nor intercostal retractions.   Abd:  No distention.  Other:  No visible deformity upon initial evaluation and in comparison to the patient's left knee.  She has some tenderness to palpation of the medial aspect of the right knee consistent with a probable medial meniscal injury.  No effusion,  patient is able to flex and extend her knee without difficulty well without any significant pain, but she feels unable to bear weight.  No injury to the ankle or foot with normal plantar and dorsiflexion.   ED Results / Procedures / Treatments   Labs (all labs ordered are listed, but only abnormal results are displayed) Labs Reviewed - No data to display   RADIOLOGY I independently viewed and interpreted the patient's knee x-rays and I see no evidence of fracture, dislocation, nor effusion.  I also read the radiologist's report, which confirmed no acute findings.   PROCEDURES:  Critical Care performed: No  Procedures    IMPRESSION / MDM / ASSESSMENT AND PLAN / ED COURSE  I reviewed the triage vital signs and the nursing notes.                              Differential diagnosis includes,  but is not limited to, Meniscal injury, fracture, dislocation, other internal knee derangement.  Patient's presentation is most consistent with acute presentation with potential threat to life or bodily function.  Labs/studies ordered: Knee x-rays of the right side  Interventions/Medications given:  Medications - No data to display  (Note:  hospital course my include additional interventions and/or labs/studies not listed above.)  Normal vital signs, reassuring physical exam with no large effusion.  Mild tenderness to palpation of the medial aspect of the knee consistent with a minor meniscal injury.  Had usual and customary conservative measures discussion with the patient, RICE, outpatient follow-up, touchdown weightbearing, crutches, Ace wrap, etc.  Patient understands and agrees with the plan.  No indication for emergent MRI at this time       FINAL CLINICAL IMPRESSION(S) / ED DIAGNOSES   Final diagnoses:  Acute pain of right knee     Rx / DC Orders   ED Discharge Orders     None        Note:  This document was prepared using Dragon voice recognition software and  may include unintentional dictation errors.   Gordan Huxley, MD 10/29/23 [phone]

## 2023-10-29 NOTE — Discharge Instructions (Signed)
 As we discussed, we suspect you have a relatively minor tear to one of the ligaments in your knee.  This likely will heal on its own given time and RICE (Rest, Ice, Compression, Elevation).  Please read to the included information.  You can bear weight as tolerated, or what we call touch weightbearing when you use your crutches.  Consider using the provided Ace wrap or another over-the-counter knee brace for comfort.  We recommend you call the office of Dr. Edie to schedule a follow-up appointment with him or one of his orthopedics colleagues for sometime next week.  You can take over-the-counter acetaminophen (Tylenol) according to label instructions.

## 2023-11-05 ENCOUNTER — Other Ambulatory Visit (INDEPENDENT_AMBULATORY_CARE_PROVIDER_SITE_OTHER)

## 2023-11-05 DIAGNOSIS — I10 Essential (primary) hypertension: Secondary | ICD-10-CM | POA: Diagnosis not present

## 2023-11-05 DIAGNOSIS — E669 Obesity, unspecified: Secondary | ICD-10-CM | POA: Diagnosis not present

## 2023-11-05 DIAGNOSIS — E785 Hyperlipidemia, unspecified: Secondary | ICD-10-CM | POA: Diagnosis not present

## 2023-11-05 LAB — COMPREHENSIVE METABOLIC PANEL WITH GFR
ALT: 11 U/L (ref 0–35)
AST: 15 U/L (ref 0–37)
Albumin: 4.5 g/dL (ref 3.5–5.2)
Alkaline Phosphatase: 89 U/L (ref 39–117)
BUN: 15 mg/dL (ref 6–23)
CO2: 35 meq/L — ABNORMAL HIGH (ref 19–32)
Calcium: 9.8 mg/dL (ref 8.4–10.5)
Chloride: 99 meq/L (ref 96–112)
Creatinine, Ser: 1.04 mg/dL (ref 0.40–1.20)
GFR: 61.1 mL/min (ref >60.00)
Glucose, Bld: 106 mg/dL — ABNORMAL HIGH (ref 70–99)
Potassium: 4.1 meq/L (ref 3.5–5.1)
Sodium: 140 meq/L (ref 135–145)
Total Bilirubin: 0.4 mg/dL (ref 0.2–1.2)
Total Protein: 7.3 g/dL (ref 6.0–8.3)

## 2023-11-05 LAB — CBC WITH DIFFERENTIAL/PLATELET
Basophils Absolute: 0 K/uL (ref 0.0–0.1)
Basophils Relative: 0.5 % (ref 0.0–3.0)
Eosinophils Absolute: 0.1 K/uL (ref 0.0–0.7)
Eosinophils Relative: 1.5 % (ref 0.0–5.0)
HCT: 40.6 % (ref 36.0–46.0)
Hemoglobin: 13 g/dL (ref 12.0–15.0)
Lymphocytes Relative: 22.9 % (ref 12.0–46.0)
Lymphs Abs: 1.2 K/uL (ref 0.7–4.0)
MCHC: 32 g/dL (ref 30.0–36.0)
MCV: 81.5 fl (ref 78.0–100.0)
Monocytes Absolute: 0.3 K/uL (ref 0.1–1.0)
Monocytes Relative: 5.8 % (ref 3.0–12.0)
Neutro Abs: 3.6 K/uL (ref 1.4–7.7)
Neutrophils Relative %: 69.3 % (ref 43.0–77.0)
Platelets: 275 K/uL (ref 150.0–400.0)
RBC: 4.99 Mil/uL (ref 3.87–5.11)
RDW: 14.7 % (ref 11.5–15.5)
WBC: 5.2 K/uL (ref 4.0–10.5)

## 2023-11-05 LAB — VITAMIN D 25 HYDROXY (VIT D DEFICIENCY, FRACTURES): VITD: 59.01 ng/mL (ref 30.00–100.00)

## 2023-11-05 LAB — LIPID PANEL
Cholesterol: 199 mg/dL (ref 0–200)
HDL: 56.9 mg/dL (ref >39.00)
LDL Cholesterol: 118 mg/dL — ABNORMAL HIGH (ref 0–99)
NonHDL: 142.36
Total CHOL/HDL Ratio: 4
Triglycerides: 122 mg/dL (ref 0.0–149.0)
VLDL: 24.4 mg/dL (ref 0.0–40.0)

## 2023-11-05 LAB — HEMOGLOBIN A1C: Hgb A1c MFr Bld: 6.6 % — ABNORMAL HIGH (ref 4.6–6.5)

## 2023-11-05 LAB — TSH: TSH: 0.97 u[IU]/mL (ref 0.35–5.50)

## 2023-11-05 LAB — FOLLICLE STIMULATING HORMONE: FSH: 21.9 m[IU]/mL

## 2023-11-10 LAB — TESTOSTERONE, FREE & TOTAL
Free Testosterone: 3.6 pg/mL (ref 0.1–6.4)
Testosterone, Total, LC-MS-MS: 28 ng/dL (ref 2–45)

## 2023-11-11 ENCOUNTER — Ambulatory Visit: Admitting: Family

## 2023-11-11 ENCOUNTER — Encounter: Payer: Self-pay | Admitting: Family

## 2023-11-11 VITALS — BP 120/70 | HR 97 | Temp 98.0°F | Ht 66.0 in | Wt 220.0 lb

## 2023-11-11 DIAGNOSIS — I10 Essential (primary) hypertension: Secondary | ICD-10-CM | POA: Diagnosis not present

## 2023-11-11 DIAGNOSIS — G4733 Obstructive sleep apnea (adult) (pediatric): Secondary | ICD-10-CM

## 2023-11-11 DIAGNOSIS — E785 Hyperlipidemia, unspecified: Secondary | ICD-10-CM

## 2023-11-11 DIAGNOSIS — E119 Type 2 diabetes mellitus without complications: Secondary | ICD-10-CM

## 2023-11-11 DIAGNOSIS — Z7984 Long term (current) use of oral hypoglycemic drugs: Secondary | ICD-10-CM

## 2023-11-11 LAB — MICROALBUMIN / CREATININE URINE RATIO
Creatinine,U: 70 mg/dL
Microalb Creat Ratio: UNDETERMINED mg/g (ref 0.0–30.0)
Microalb, Ur: <0.7 mg/dL

## 2023-11-11 NOTE — Assessment & Plan Note (Signed)
Excellent control.  Continue olmesartan-hydrochlorothiazide 40-12.5 mg, amlodipine 10mg  qd

## 2023-11-11 NOTE — Progress Notes (Signed)
 Assessment & Plan:  Diabetes mellitus without complication [facility]) Assessment & Plan: Lab Results  Component Value Date   HGBA1C 6.6 (H) 11/05/2023   Excellent control.  Continue metformin  500 mg twice daily  Orders: -     Microalbumin / creatinine urine ratio  Hyperlipidemia, unspecified hyperlipidemia type Assessment & Plan: Lab Results  Component Value Date   LDLCALC 118 (H) 11/05/2023   The 10-year ASCVD risk score (Mykel Mohl DK, et al., 2019) is: 8.2% Advised statin therapy to achieve LDL < 70 She will continue to consider this.    OSA (obstructive sleep apnea) Assessment & Plan: Discussed FDA approval of Zepbound for OSA. Discussed AE, MOA , titration and black box warning as it r/t MTC and MEN. She will consider zepbound.    Essential hypertension Assessment & Plan: Excellent control.  Continue olmesartan -hydrochlorothiazide  40-12.5 mg, amlodipine  10mg  qd      Return precautions given.   Risks, benefits, and alternatives of the medications and treatment plan prescribed today were discussed, and patient expressed understanding.   Education regarding symptom management and diagnosis given to patient on AVS either electronically or printed.  Return in about 3 months (around 02/11/2024).  Rollene Northern, FNP  Subjective:    Patient ID: Katrina Roth, female    DOB: 11-14-1970, 53 y.o.   MRN: [id number]  CC: Katrina Roth is a 53 y.o. female who presents today for follow up.   HPI: She feels well today No new complaints Remains frustrated by weight.   She has previously been on saxenda  Compliant with cipap.   No personal or family h/o MTC, MEN.    No h/o pancreatitis No family h/o heart disease    Allergies: Codeine, Lactose intolerance (gi), and Other Current Outpatient Medications on File Prior to Visit  Medication Sig Dispense Refill   amLODipine  (NORVASC ) 10 MG tablet Take 1 tablet (10 mg total) by mouth daily. Needs appt before next  refill. 90 tablet 3   COVID-19 mRNA vaccine, Pfizer, (COMIRNATY ) syringe Inject 0.3 mLs into the muscle. 0.3 mL 0   metFORMIN  (GLUCOPHAGE -XR) 500 MG 24 hr tablet Take 1 tablet (500 mg total) by mouth 2 (two) times daily with a meal. 180 tablet 3   NON FORMULARY Chlorella green algae supplement     olmesartan -hydrochlorothiazide  (BENICAR  HCT) 40-12.5 MG tablet Take 1 tablet by mouth daily. 90 tablet 3   Red Yeast Rice Extract (RED YEAST RICE PO) Take by mouth.     No current facility-administered medications on file prior to visit.    Review of Systems  Constitutional:  Negative for chills and fever.  Respiratory:  Negative for cough.   Cardiovascular:  Negative for chest pain and palpitations.  Gastrointestinal:  Negative for nausea and vomiting.      Objective:    BP 120/70   Pulse 97   Temp 98 F (36.7 C) (Oral)   Ht 5' 6 (1.676 m)   Wt 220 lb (99.8 kg)   SpO2 98%   BMI 35.51 kg/m  BP Readings from Last 3 Encounters:  11/11/23 120/70  10/29/23 118/73  06/14/23 130/70   Wt Readings from Last 3 Encounters:  11/11/23 220 lb (99.8 kg)  10/28/23 215 lb (97.5 kg)  06/14/23 220 lb 6.4 oz (100 kg)    Physical Exam Vitals reviewed.  Constitutional:      Appearance: She is well-developed.  Eyes:     Conjunctiva/sclera: Conjunctivae normal.  Cardiovascular:     Rate and Rhythm:  Normal rate and regular rhythm.     Pulses: Normal pulses.     Heart sounds: Normal heart sounds.  Pulmonary:     Effort: Pulmonary effort is normal.     Breath sounds: Normal breath sounds. No wheezing, rhonchi or rales.  Skin:    General: Skin is warm and dry.  Neurological:     Mental Status: She is alert.  Psychiatric:        Speech: Speech normal.        Behavior: Behavior normal.        Thought Content: Thought content normal.

## 2023-11-11 NOTE — Assessment & Plan Note (Signed)
 Lab Results  Component Value Date   HGBA1C 6.6 (H) 11/05/2023   Excellent control.  Continue metformin  500 mg twice daily

## 2023-11-11 NOTE — Assessment & Plan Note (Addendum)
 Lab Results  Component Value Date   LDLCALC 118 (H) 11/05/2023   The 10-year ASCVD risk score (Edithe Dobbin DK, et al., 2019) is: 8.2% Advised statin therapy to achieve LDL < 70 She will continue to consider this.

## 2023-11-11 NOTE — Assessment & Plan Note (Signed)
 Discussed FDA approval of Zepbound for OSA. Discussed AE, MOA , titration and black box warning as it r/t MTC and MEN. She will consider zepbound.

## 2023-11-11 NOTE — Patient Instructions (Addendum)
 Consider Zepbound as it is approved for sleep apnea. It is the sister drug to Mounjaro.    If you would like to further stratify your cardiovascular risk or consider medication management, I would first recommend obtaining a CT calcium score.  Information included below.  Please let me know if you would like to order and I will place.    Once I have ordered, you may schedule on your own by calling [phone] ( [facility] [address] in [place] ).   An estimate of cost is $150-200 out-of-pocket as not covered by insurance.    Below an article from [facility] regarding the test.   [email or web address]://[email or web address]/imaging/exams-and-procedures/screenings/cardiac-ct#:~:text=A%20cardiac%20CT%20calcium%20score,arteries%20can%20cause%20heart%20attacks.   Exams We Offer: Cardiac CT Calcium Score  Knowing your score could save your life. A cardiac CT calcium score, also known as a coronary calcium scan, is a quick, convenient and noninvasive way of evaluating the amount of calcified (hard) plaque in your heart vessels. The level of calcium equates to the extent of plaque build-up in your arteries. Plaque in the arteries can cause heart attacks.  The radiologist reads the images and sends your doctor a report with a calcium score. Patients with higher scores have a greater risk for a heart attack, heart disease or stroke. Knowing your score can help your doctor decide on blood pressure and cholesterol goals that will minimize your risk as much as possible.  The [employer] found that Coronary artery calcification (CAC) is an excellent cardiovascular disease risk marker and can help guide the decision to use cholesterol reducing medications such as statins. A negative calcium score may reduce the need for statins in otherwise eligible patients.  The exam takes less than 10 minutes, is painless and does not require any IV or oral contrast.  Who should get a Cardiac CT  Calcium Score: Middle age adults at intermediate risk of heart disease Family history of heart disease Borderline high cholesterol, high blood pressure or diabetes Overweight or physical inactivity Uncertain about taking daily preventive medical therapy

## 2023-11-12 ENCOUNTER — Other Ambulatory Visit: Payer: Self-pay | Admitting: Family

## 2023-11-12 ENCOUNTER — Other Ambulatory Visit: Payer: Self-pay

## 2023-11-12 DIAGNOSIS — E119 Type 2 diabetes mellitus without complications: Secondary | ICD-10-CM

## 2023-11-12 DIAGNOSIS — E785 Hyperlipidemia, unspecified: Secondary | ICD-10-CM

## 2023-11-12 DIAGNOSIS — E669 Obesity, unspecified: Secondary | ICD-10-CM

## 2023-11-12 DIAGNOSIS — G4733 Obstructive sleep apnea (adult) (pediatric): Secondary | ICD-10-CM

## 2023-11-12 MED ORDER — ZEPBOUND 2.5 MG/0.5ML ~~LOC~~ SOAJ
2.5000 mg | SUBCUTANEOUS | 1 refills | Status: DC
  Filled 2023-11-12 – 2023-11-15 (×2): qty 2, 28d supply, fill #0

## 2023-11-15 ENCOUNTER — Other Ambulatory Visit: Payer: Self-pay

## 2023-11-15 ENCOUNTER — Ambulatory Visit: Payer: Self-pay | Admitting: Family

## 2023-11-17 ENCOUNTER — Telehealth: Payer: Self-pay | Admitting: Pharmacy Technician

## 2023-11-17 ENCOUNTER — Other Ambulatory Visit (HOSPITAL_COMMUNITY): Payer: Self-pay

## 2023-11-17 NOTE — Telephone Encounter (Signed)
 Pharmacy Patient Advocate Encounter  Received notification from [employer] that Prior Authorization for Zepbound  2.5mg /0.61ml auto-injectors  has been CANCELLED due to   PA #/Case ID/Reference #: [id number]

## 2023-11-17 NOTE — Telephone Encounter (Signed)
 Pharmacy Patient Advocate Encounter   Received notification from Onbase that prior authorization for Zepbound  2.5mg /0.25ml auto-injectors is required/requested.   Insurance verification completed.   The patient is insured through [employer] .   Per test claim: PA required; PA submitted to above mentioned insurance via LAT[employer] Key/confirmation #/[facility] Status is pending

## 2023-12-20 ENCOUNTER — Other Ambulatory Visit: Payer: Self-pay | Admitting: Family

## 2023-12-20 ENCOUNTER — Other Ambulatory Visit: Payer: Self-pay

## 2023-12-20 DIAGNOSIS — I10 Essential (primary) hypertension: Secondary | ICD-10-CM

## 2023-12-20 MED ORDER — OLMESARTAN MEDOXOMIL-HCTZ 40-12.5 MG PO TABS
1.0000 | ORAL_TABLET | Freq: Every day | ORAL | 3 refills | Status: AC
  Filled 2023-12-20: qty 90, 90d supply, fill #0
  Filled 2024-03-25: qty 90, 90d supply, fill #1
  Filled 2024-05-02: qty 90, 90d supply, fill #2

## 2024-01-28 ENCOUNTER — Encounter: Payer: Self-pay | Admitting: Family

## 2024-02-07 ENCOUNTER — Other Ambulatory Visit: Payer: Self-pay | Admitting: Family

## 2024-02-07 ENCOUNTER — Other Ambulatory Visit: Payer: Self-pay

## 2024-02-07 DIAGNOSIS — I10 Essential (primary) hypertension: Secondary | ICD-10-CM

## 2024-02-08 ENCOUNTER — Other Ambulatory Visit: Payer: Self-pay

## 2024-02-08 MED FILL — Amlodipine Besylate Tab 10 MG (Base Equivalent): ORAL | 90 days supply | Qty: 90 | Fill #0 | Status: AC

## 2024-02-09 ENCOUNTER — Other Ambulatory Visit: Payer: Self-pay

## 2024-02-09 MED ORDER — FLUZONE 0.5 ML IM SUSY
0.5000 mL | PREFILLED_SYRINGE | Freq: Once | INTRAMUSCULAR | 0 refills | Status: AC
  Filled 2024-02-09: qty 0.5, 1d supply, fill #0

## 2024-02-11 ENCOUNTER — Ambulatory Visit: Admitting: Family

## 2024-02-11 ENCOUNTER — Encounter: Payer: Self-pay | Admitting: Family

## 2024-02-11 VITALS — BP 122/78 | HR 100 | Temp 98.4°F | Ht 66.0 in | Wt 219.2 lb

## 2024-02-11 DIAGNOSIS — E119 Type 2 diabetes mellitus without complications: Secondary | ICD-10-CM

## 2024-02-11 DIAGNOSIS — Z7984 Long term (current) use of oral hypoglycemic drugs: Secondary | ICD-10-CM | POA: Diagnosis not present

## 2024-02-11 DIAGNOSIS — I10 Essential (primary) hypertension: Secondary | ICD-10-CM | POA: Diagnosis not present

## 2024-02-11 DIAGNOSIS — Z1231 Encounter for screening mammogram for malignant neoplasm of breast: Secondary | ICD-10-CM | POA: Diagnosis not present

## 2024-02-11 LAB — BASIC METABOLIC PANEL WITH GFR
BUN: 15 mg/dL (ref 6–23)
CO2: 30 meq/L (ref 19–32)
Calcium: 9.4 mg/dL (ref 8.4–10.5)
Chloride: 104 meq/L (ref 96–112)
Creatinine, Ser: 1.31 mg/dL — ABNORMAL HIGH (ref 0.40–1.20)
GFR: 46.23 mL/min — ABNORMAL LOW (ref >60.00)
Glucose, Bld: 94 mg/dL (ref 70–99)
Potassium: 4 meq/L (ref 3.5–5.1)
Sodium: 143 meq/L (ref 135–145)

## 2024-02-11 LAB — HEMOGLOBIN A1C: Hgb A1c MFr Bld: 6.5 % (ref 4.6–6.5)

## 2024-02-11 NOTE — Assessment & Plan Note (Signed)
Excellent control.  Continue olmesartan-hydrochlorothiazide 40-12.5 mg, amlodipine 10mg  qd

## 2024-02-11 NOTE — Progress Notes (Signed)
 Assessment & Plan:  Diabetes mellitus without complication (HCC) Assessment & Plan: Pending A1c. Presume controlled. Continue metformin  500mg  BID  Orders: -     Basic metabolic panel with GFR -     Hemoglobin A1c  Essential hypertension Assessment & Plan: Excellent control.  Continue olmesartan -hydrochlorothiazide  40-12.5 mg, amlodipine  10mg  qd   Encounter for screening mammogram for malignant neoplasm of breast -     3D Screening Mammogram, Left and Right; Future     Return precautions given.   Risks, benefits, and alternatives of the medications and treatment plan prescribed today were discussed, and patient expressed understanding.   Education regarding symptom management and diagnosis given to patient on AVS either electronically or printed.  Return in about 3 months (around 05/13/2024) for Complete Physical Exam.  Rollene Northern, FNP  Subjective:    Patient ID: Katrina Roth, female    DOB: 1970/12/22, 53 y.o.   MRN: [id number]  CC: Katrina Roth is a 53 y.o. female who presents today for follow up.   HPI: Feels well today No new complaints Zepbound  was not approved for moderate OSA Compliant with metformin  500mg  bid and tolerating well     Allergies: Codeine, Lactose intolerance (gi), and Other Current Outpatient Medications on File Prior to Visit  Medication Sig Dispense Refill   amLODipine  (NORVASC ) 10 MG tablet Take 1 tablet (10 mg total) by mouth daily. Needs appt before next refill. 90 tablet 3   COVID-19 mRNA vaccine, Pfizer, (COMIRNATY ) syringe Inject 0.3 mLs into the muscle. 0.3 mL 0   metFORMIN  (GLUCOPHAGE -XR) 500 MG 24 hr tablet Take 1 tablet (500 mg total) by mouth 2 (two) times daily with a meal. 180 tablet 3   NON FORMULARY Chlorella green algae supplement     olmesartan -hydrochlorothiazide  (BENICAR  HCT) 40-12.5 MG tablet Take 1 tablet by mouth daily. 90 tablet 3   Red Yeast Rice Extract (RED YEAST RICE PO) Take by mouth.     No current  facility-administered medications on file prior to visit.    Review of Systems  Constitutional:  Negative for chills and fever.  Respiratory:  Negative for cough.   Cardiovascular:  Negative for chest pain and palpitations.  Gastrointestinal:  Negative for nausea and vomiting.      Objective:    BP 122/78   Pulse 100   Temp 98.4 F (36.9 C) (Oral)   Ht 5' 6 (1.676 m)   Wt 219 lb 3.2 oz (99.4 kg)   SpO2 97%   BMI 35.38 kg/m  BP Readings from Last 3 Encounters:  02/11/24 122/78  11/11/23 120/70  10/29/23 118/73   Wt Readings from Last 3 Encounters:  02/11/24 219 lb 3.2 oz (99.4 kg)  11/11/23 220 lb (99.8 kg)  10/28/23 215 lb (97.5 kg)    Physical Exam Vitals reviewed.  Constitutional:      Appearance: She is well-developed.  Eyes:     Conjunctiva/sclera: Conjunctivae normal.  Cardiovascular:     Rate and Rhythm: Normal rate and regular rhythm.     Pulses: Normal pulses.     Heart sounds: Normal heart sounds.  Pulmonary:     Effort: Pulmonary effort is normal.     Breath sounds: Normal breath sounds. No wheezing, rhonchi or rales.  Skin:    General: Skin is warm and dry.  Neurological:     Mental Status: She is alert.  Psychiatric:        Speech: Speech normal.  Behavior: Behavior normal.        Thought Content: Thought content normal.

## 2024-02-11 NOTE — Patient Instructions (Addendum)
Please call  and schedule your 3D mammogram and /or bone density scan as we discussed.   [facility]  ( new location in 2023)  [address], [place], [place] [address]  [place]  [phone]  Nice to see you!  

## 2024-02-11 NOTE — Assessment & Plan Note (Signed)
 Pending A1c. Presume controlled. Continue metformin  500mg  BID

## 2024-02-14 ENCOUNTER — Ambulatory Visit: Payer: Self-pay | Admitting: Family

## 2024-02-14 DIAGNOSIS — E119 Type 2 diabetes mellitus without complications: Secondary | ICD-10-CM

## 2024-02-24 ENCOUNTER — Ambulatory Visit: Payer: Self-pay | Admitting: Family

## 2024-02-24 ENCOUNTER — Other Ambulatory Visit (INDEPENDENT_AMBULATORY_CARE_PROVIDER_SITE_OTHER)

## 2024-02-24 DIAGNOSIS — E119 Type 2 diabetes mellitus without complications: Secondary | ICD-10-CM

## 2024-02-24 DIAGNOSIS — N189 Chronic kidney disease, unspecified: Secondary | ICD-10-CM

## 2024-02-24 LAB — BASIC METABOLIC PANEL WITH GFR
BUN: 15 mg/dL (ref 6–23)
CO2: 31 meq/L (ref 19–32)
Calcium: 9.4 mg/dL (ref 8.4–10.5)
Chloride: 104 meq/L (ref 96–112)
Creatinine, Ser: 1.37 mg/dL — ABNORMAL HIGH (ref 0.40–1.20)
GFR: 43.8 mL/min — ABNORMAL LOW (ref >60.00)
Glucose, Bld: 120 mg/dL — ABNORMAL HIGH (ref 70–99)
Potassium: 3.8 meq/L (ref 3.5–5.1)
Sodium: 144 meq/L (ref 135–145)

## 2024-03-13 ENCOUNTER — Other Ambulatory Visit: Payer: Self-pay | Admitting: Nephrology

## 2024-03-13 DIAGNOSIS — N1832 Chronic kidney disease, stage 3b: Secondary | ICD-10-CM

## 2024-03-13 DIAGNOSIS — E1122 Type 2 diabetes mellitus with diabetic chronic kidney disease: Secondary | ICD-10-CM

## 2024-03-26 ENCOUNTER — Other Ambulatory Visit (HOSPITAL_COMMUNITY): Payer: Self-pay

## 2024-03-28 ENCOUNTER — Other Ambulatory Visit (HOSPITAL_COMMUNITY): Payer: Self-pay

## 2024-05-02 ENCOUNTER — Other Ambulatory Visit: Payer: Self-pay

## 2024-05-02 ENCOUNTER — Other Ambulatory Visit: Payer: Self-pay | Admitting: Family

## 2024-05-02 DIAGNOSIS — R7303 Prediabetes: Secondary | ICD-10-CM

## 2024-05-02 MED FILL — Amlodipine Besylate Tab 10 MG (Base Equivalent): ORAL | 90 days supply | Qty: 90 | Fill #1 | Status: CN

## 2024-05-03 ENCOUNTER — Other Ambulatory Visit: Payer: Self-pay

## 2024-05-03 ENCOUNTER — Other Ambulatory Visit (HOSPITAL_COMMUNITY): Payer: Self-pay

## 2024-05-03 MED ORDER — METFORMIN HCL ER 500 MG PO TB24
500.0000 mg | ORAL_TABLET | Freq: Two times a day (BID) | ORAL | 3 refills | Status: AC
  Filled 2024-05-03 (×2): qty 180, 90d supply, fill #0

## 2024-05-03 MED FILL — Amlodipine Besylate Tab 10 MG (Base Equivalent): ORAL | 90 days supply | Qty: 90 | Fill #0 | Status: AC

## 2024-05-15 ENCOUNTER — Ambulatory Visit: Admitting: Family
